# Patient Record
Sex: Female | Born: 1935 | Race: Black or African American | Hispanic: No | Marital: Single | Smoking: Never smoker
Health system: Southern US, Community
[De-identification: ages and names within clinical notes are randomized; demographics above are authoritative.]

## PROBLEM LIST (undated history)

## (undated) DIAGNOSIS — H269 Unspecified cataract: Secondary | ICD-10-CM

## (undated) DIAGNOSIS — E785 Hyperlipidemia, unspecified: Secondary | ICD-10-CM

## (undated) DIAGNOSIS — I10 Essential (primary) hypertension: Secondary | ICD-10-CM

## (undated) DIAGNOSIS — M1990 Unspecified osteoarthritis, unspecified site: Secondary | ICD-10-CM

## (undated) DIAGNOSIS — M810 Age-related osteoporosis without current pathological fracture: Secondary | ICD-10-CM

## (undated) DIAGNOSIS — H409 Unspecified glaucoma: Secondary | ICD-10-CM

## (undated) HISTORY — DX: Hyperlipidemia, unspecified: E78.5

## (undated) HISTORY — DX: Age-related osteoporosis without current pathological fracture: M81.0

## (undated) HISTORY — DX: Essential (primary) hypertension: I10

## (undated) HISTORY — DX: Unspecified glaucoma: H40.9

## (undated) HISTORY — DX: Unspecified osteoarthritis, unspecified site: M19.90

## (undated) HISTORY — DX: Unspecified cataract: H26.9

---

## 2005-02-01 ENCOUNTER — Ambulatory Visit: Payer: Self-pay | Admitting: Gastroenterology

## 2010-01-04 HISTORY — PX: EYE SURGERY: SHX253

## 2011-05-11 ENCOUNTER — Ambulatory Visit: Payer: Self-pay | Admitting: Internal Medicine

## 2011-11-26 ENCOUNTER — Ambulatory Visit: Payer: Self-pay | Admitting: Internal Medicine

## 2012-07-23 ENCOUNTER — Emergency Department: Payer: Self-pay | Admitting: Emergency Medicine

## 2012-07-23 LAB — COMPREHENSIVE METABOLIC PANEL
Albumin: 3.8 g/dL (ref 3.4–5.0)
Alkaline Phosphatase: 90 U/L (ref 50–136)
Anion Gap: 5 — ABNORMAL LOW (ref 7–16)
BUN: 14 mg/dL (ref 7–18)
Chloride: 106 mmol/L (ref 98–107)
EGFR (African American): 41 — ABNORMAL LOW
Glucose: 137 mg/dL — ABNORMAL HIGH (ref 65–99)
Osmolality: 278 (ref 275–301)
SGOT(AST): 21 U/L (ref 15–37)
SGPT (ALT): 14 U/L (ref 12–78)
Sodium: 138 mmol/L (ref 136–145)
Total Protein: 7.5 g/dL (ref 6.4–8.2)

## 2012-07-23 LAB — URINALYSIS, COMPLETE
Bacteria: NONE SEEN
Blood: NEGATIVE
Glucose,UR: NEGATIVE mg/dL (ref 0–75)
Nitrite: NEGATIVE
Ph: 5 (ref 4.5–8.0)
Specific Gravity: 1.018 (ref 1.003–1.030)
Squamous Epithelial: 4
WBC UR: 7 /HPF (ref 0–5)

## 2012-07-23 LAB — CBC
HCT: 39.2 % (ref 35.0–47.0)
HGB: 12.8 g/dL (ref 12.0–16.0)
MCH: 28.4 pg (ref 26.0–34.0)
MCHC: 32.7 g/dL (ref 32.0–36.0)
MCV: 87 fL (ref 80–100)
RBC: 4.5 10*6/uL (ref 3.80–5.20)
WBC: 7 10*3/uL (ref 3.6–11.0)

## 2012-07-23 LAB — TROPONIN I: Troponin-I: <0.02 ng/mL

## 2013-04-19 ENCOUNTER — Emergency Department: Payer: Self-pay | Admitting: Internal Medicine

## 2013-04-19 LAB — COMPREHENSIVE METABOLIC PANEL
ALK PHOS: 78 U/L
ANION GAP: 7 (ref 7–16)
AST: 22 U/L (ref 15–37)
Albumin: 3.6 g/dL (ref 3.4–5.0)
BILIRUBIN TOTAL: 0.8 mg/dL (ref 0.2–1.0)
BUN: 12 mg/dL (ref 7–18)
CO2: 25 mmol/L (ref 21–32)
Calcium, Total: 9.2 mg/dL (ref 8.5–10.1)
Chloride: 107 mmol/L (ref 98–107)
Creatinine: 1.08 mg/dL (ref 0.60–1.30)
GFR CALC AF AMER: 52 — AB
GFR CALC NON AF AMER: 45 — AB
Glucose: 95 mg/dL (ref 65–99)
Osmolality: 277 (ref 275–301)
POTASSIUM: 3.5 mmol/L (ref 3.5–5.1)
SGPT (ALT): 16 U/L (ref 12–78)
SODIUM: 139 mmol/L (ref 136–145)
TOTAL PROTEIN: 7.8 g/dL (ref 6.4–8.2)

## 2013-04-19 LAB — URINALYSIS, COMPLETE
Bacteria: NONE SEEN
Bilirubin,UR: NEGATIVE
Glucose,UR: NEGATIVE mg/dL (ref 0–75)
KETONE: NEGATIVE
Leukocyte Esterase: NEGATIVE
Nitrite: NEGATIVE
Ph: 8 (ref 4.5–8.0)
Protein: NEGATIVE
Specific Gravity: 1.003 (ref 1.003–1.030)
Squamous Epithelial: <1

## 2013-04-19 LAB — CBC
HCT: 40.9 % (ref 35.0–47.0)
HGB: 13.3 g/dL (ref 12.0–16.0)
MCH: 28.6 pg (ref 26.0–34.0)
MCHC: 32.5 g/dL (ref 32.0–36.0)
MCV: 88 fL (ref 80–100)
Platelet: 220 10*3/uL (ref 150–440)
RBC: 4.64 10*6/uL (ref 3.80–5.20)
RDW: 15 % — ABNORMAL HIGH (ref 11.5–14.5)
WBC: 5.3 10*3/uL (ref 3.6–11.0)

## 2013-04-19 LAB — TROPONIN I

## 2013-07-04 ENCOUNTER — Encounter: Payer: Self-pay | Admitting: General Surgery

## 2013-07-04 ENCOUNTER — Ambulatory Visit (INDEPENDENT_AMBULATORY_CARE_PROVIDER_SITE_OTHER): Payer: Medicare Other | Admitting: General Surgery

## 2013-07-04 VITALS — BP 130/68 | HR 78 | Resp 14 | Ht 62.0 in | Wt 138.0 lb

## 2013-07-04 DIAGNOSIS — D1739 Benign lipomatous neoplasm of skin and subcutaneous tissue of other sites: Secondary | ICD-10-CM

## 2013-07-04 DIAGNOSIS — D171 Benign lipomatous neoplasm of skin and subcutaneous tissue of trunk: Secondary | ICD-10-CM

## 2013-07-04 NOTE — Patient Instructions (Addendum)
Patient to be scheduled for surgery. The patient is aware to call back for any questions or concerns.  Patient's surgery has been scheduled for 07-11-13 at [facility]. This patient has been asked to decrease current 325 mg aspirin to 81 mg aspirin starting one week prior to procedure.

## 2013-07-04 NOTE — Progress Notes (Signed)
Patient ID: Jenna Arnold, female   DOB: 1920/02/19, 80 y.o.   MRN: [id number]  Chief Complaint  Patient presents with  . Other    Evaluation of Adrenal Tumor    HPI Jenna Arnold is a 80 y.o. female who presents for an evaluation of adrenal tumor. She had an abdominal x-ray performed on 07/02/13. The patient complains of left flank pain that is swollen. She states the pain is so severe she vomits at time. She also has loss of appetite.This started back in April 2015. The pain is controlled by pain medication. The patient also had a CT of the abdomen/pelvis in April 2015. No fever. The pain is described as an aching sensation on the inside. Pain can not be reproduced with touch. No problems with bowel movements or urination.   HPI  Past Medical History  Diagnosis Date  . Hypertension   . Hyperlipidemia   . Arthritis     Past Surgical History  Procedure Laterality Date  . Eye surgery Bilateral 2012    History reviewed. No pertinent family history.  Social History History  Substance Use Topics  . Smoking status: Never Smoker   . Smokeless tobacco: Current User    Types: Snuff  . Alcohol Use: Yes    No Known Allergies  Current Outpatient Prescriptions  Medication Sig Dispense Refill  . amLODipine (NORVASC) 10 MG tablet Take 10 mg by mouth daily.      Marland Kitchen aspirin 325 MG tablet Take 325 mg by mouth daily.      Marland Kitchen atorvastatin (LIPITOR) 40 MG tablet Take 40 mg by mouth daily.      Marland Kitchen CALCIUM PO Take 1 tablet by mouth daily.      . metoprolol succinate (TOPROL-XL) 50 MG 24 hr tablet Take 50 mg by mouth daily. Take with or immediately following a meal.      . traMADol-acetaminophen (ULTRACET) 37.5-325 MG per tablet Take 1 tablet by mouth daily.       No current facility-administered medications for this visit.    Review of Systems Review of Systems  Constitutional: Negative.   Respiratory: Negative.   Cardiovascular: Negative.   Gastrointestinal: Positive for vomiting.     Blood pressure 130/68, pulse 78, resp. rate 14, height 5\' 2"  (1.575 m), weight 138 lb (62.596 kg).  Physical Exam Physical Exam  Constitutional: She is oriented to person, place, and time. She appears well-developed and well-nourished.  Cardiovascular: Normal rate, regular rhythm and normal heart sounds.   No murmur heard. Pulmonary/Chest: Effort normal and breath sounds normal.  Abdominal: Soft. Normal appearance and bowel sounds are normal. There is no hepatosplenomegaly. There is no tenderness.  Musculoskeletal:       Arms: Neurological: She is alert and oriented to person, place, and time.  Skin: Skin is warm and dry.    Data Reviewed  CT of abdomen/pelvis reviewed. 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 However this has been stable in size for a long time. Large lipoma left flank, likely causing her pain.  Assessment    6 cm lipoma on the left flank. Likely causing pain in the left flank.     Plan    Excision of left flank lipoma in outpatient surgery. Under  MAC. Discussed fully with pt and she is agreeable.    Patient's surgery has been scheduled for 07-11-13 at [facility]. This patient has been asked to decrease current 325 mg aspirin to 81 mg aspirin starting one week prior to procedure.  SANKAR,SEEPLAPUTHUR G 07/05/2013, 8:52 AM

## 2013-07-05 ENCOUNTER — Encounter: Payer: Self-pay | Admitting: General Surgery

## 2013-07-05 NOTE — Addendum Note (Signed)
Addended by: Christene Lye on: 07/05/2013 08:59 AM   Modules accepted: Orders

## 2013-07-09 ENCOUNTER — Ambulatory Visit: Payer: Self-pay | Admitting: General Surgery

## 2013-07-09 LAB — CBC WITH DIFFERENTIAL/PLATELET
BASOS ABS: 0.1 10*3/uL (ref 0.0–0.1)
Basophil %: 1.2 %
EOS PCT: 7.1 %
Eosinophil #: 0.4 10*3/uL (ref 0.0–0.7)
HCT: 38.1 % (ref 35.0–47.0)
HGB: 12.6 g/dL (ref 12.0–16.0)
LYMPHS ABS: 1.5 10*3/uL (ref 1.0–3.6)
LYMPHS PCT: 27.4 %
MCH: 29.1 pg (ref 26.0–34.0)
MCHC: 32.9 g/dL (ref 32.0–36.0)
MCV: 88 fL (ref 80–100)
MONO ABS: 0.6 x10 3/mm (ref 0.2–0.9)
MONOS PCT: 11.1 %
Neutrophil #: 2.9 10*3/uL (ref 1.4–6.5)
Neutrophil %: 53.2 %
Platelet: 279 10*3/uL (ref 150–440)
RBC: 4.32 10*6/uL (ref 3.80–5.20)
RDW: 14.6 % — ABNORMAL HIGH (ref 11.5–14.5)
WBC: 5.5 10*3/uL (ref 3.6–11.0)

## 2013-07-09 LAB — BASIC METABOLIC PANEL
Anion Gap: 4 — ABNORMAL LOW (ref 7–16)
BUN: 12 mg/dL (ref 7–18)
CHLORIDE: 106 mmol/L (ref 98–107)
CO2: 30 mmol/L (ref 21–32)
Calcium, Total: 9.5 mg/dL (ref 8.5–10.1)
Creatinine: 1.13 mg/dL (ref 0.60–1.30)
EGFR (African American): 49 — ABNORMAL LOW
GFR CALC NON AF AMER: 42 — AB
Glucose: 98 mg/dL (ref 65–99)
OSMOLALITY: 279 (ref 275–301)
Potassium: 4 mmol/L (ref 3.5–5.1)
Sodium: 140 mmol/L (ref 136–145)

## 2013-07-11 ENCOUNTER — Ambulatory Visit: Payer: Self-pay | Admitting: General Surgery

## 2013-07-11 DIAGNOSIS — D216 Benign neoplasm of connective and other soft tissue of trunk, unspecified: Secondary | ICD-10-CM

## 2013-07-11 HISTORY — PX: LIPOMA EXCISION: SHX5283

## 2013-07-12 ENCOUNTER — Encounter: Payer: Self-pay | Admitting: General Surgery

## 2013-07-13 LAB — PATHOLOGY REPORT

## 2013-07-17 ENCOUNTER — Encounter: Payer: Self-pay | Admitting: General Surgery

## 2013-07-25 ENCOUNTER — Ambulatory Visit (INDEPENDENT_AMBULATORY_CARE_PROVIDER_SITE_OTHER): Payer: Self-pay | Admitting: General Surgery

## 2013-07-25 ENCOUNTER — Encounter: Payer: Self-pay | Admitting: General Surgery

## 2013-07-25 VITALS — BP 124/60 | HR 62 | Resp 14 | Wt 137.0 lb

## 2013-07-25 DIAGNOSIS — D1739 Benign lipomatous neoplasm of skin and subcutaneous tissue of other sites: Secondary | ICD-10-CM

## 2013-07-25 DIAGNOSIS — D171 Benign lipomatous neoplasm of skin and subcutaneous tissue of trunk: Secondary | ICD-10-CM

## 2013-07-25 NOTE — Progress Notes (Signed)
Patient ID: Jenna Arnold, female   DOB: 1920/12/12, 80 y.o.   MRN: [id number]  The patient presents for a post op lipoma removed from the left flank. The procedure was performed on 07/11/13. No complaints at this time.   Pt is happy -no more left flank discomfort. Path- benign lipoma. Incision   healing well. No signs of infection/serma.  Patient to return as needed.

## 2013-07-25 NOTE — Patient Instructions (Signed)
Patient to return as needed. The patient is aware to call back for any questions or concerns. 

## 2013-11-05 ENCOUNTER — Encounter: Payer: Self-pay | Admitting: General Surgery

## 2013-11-07 ENCOUNTER — Emergency Department: Payer: Self-pay | Admitting: Emergency Medicine

## 2013-11-07 LAB — COMPREHENSIVE METABOLIC PANEL
ALT: 15 U/L
Albumin: 3.5 g/dL (ref 3.4–5.0)
Alkaline Phosphatase: 84 U/L
Anion Gap: 9 (ref 7–16)
BUN: 13 mg/dL (ref 7–18)
Bilirubin,Total: 0.8 mg/dL (ref 0.2–1.0)
CREATININE: 1.4 mg/dL — AB (ref 0.60–1.30)
Calcium, Total: 9 mg/dL (ref 8.5–10.1)
Chloride: 106 mmol/L (ref 98–107)
Co2: 25 mmol/L (ref 21–32)
EGFR (African American): 45 — ABNORMAL LOW
GFR CALC NON AF AMER: 37 — AB
Glucose: 121 mg/dL — ABNORMAL HIGH (ref 65–99)
OSMOLALITY: 281 (ref 275–301)
Potassium: 4.6 mmol/L (ref 3.5–5.1)
SGOT(AST): 29 U/L (ref 15–37)
Sodium: 140 mmol/L (ref 136–145)
TOTAL PROTEIN: 7.6 g/dL (ref 6.4–8.2)

## 2013-11-07 LAB — CBC
HCT: 40.1 % (ref 35.0–47.0)
HGB: 12.8 g/dL (ref 12.0–16.0)
MCH: 28.5 pg (ref 26.0–34.0)
MCHC: 32 g/dL (ref 32.0–36.0)
MCV: 89 fL (ref 80–100)
Platelet: 236 10*3/uL (ref 150–440)
RBC: 4.5 10*6/uL (ref 3.80–5.20)
RDW: 14.9 % — AB (ref 11.5–14.5)
WBC: 5.6 10*3/uL (ref 3.6–11.0)

## 2013-11-07 LAB — URINALYSIS, COMPLETE
BLOOD: NEGATIVE
Bilirubin,UR: NEGATIVE
Glucose,UR: NEGATIVE mg/dL (ref 0–75)
Hyaline Cast: 11
Ketone: NEGATIVE
NITRITE: NEGATIVE
Ph: 6 (ref 4.5–8.0)
Protein: 30
RBC,UR: 2 /HPF (ref 0–5)
Specific Gravity: 1.008 (ref 1.003–1.030)
WBC UR: 3 /HPF (ref 0–5)

## 2013-11-07 LAB — LIPASE, BLOOD: Lipase: 94 U/L (ref 73–393)

## 2013-11-07 LAB — TROPONIN I: Troponin-I: <0.02 ng/mL

## 2014-04-27 NOTE — Op Note (Signed)
PATIENT NAME:  Jenna Arnold, Jenna Arnold MR#:  [id number] DATE OF BIRTH:  Aug 14, 1920  DATE OF PROCEDURE:  07/11/2013.  PREOPERATIVE DIAGNOSIS: Subcutaneous mass in the left flank.   POSTOPERATIVE DIAGNOSIS: Lipoma located in the left flank involving the muscle.   OPERATION PERFORMED: Excision of lipoma left flank area.   SURGEON: Mckinley Jewel, M.D.   ANESTHESIA: Monitored care and with the use of 0.5% Marcaine mixed with 1% Xylocaine. A total of 17 mL used.   PROCEDURE: The patient was placed in supine position on the operating table and then placed in the slight right lateral position, held in place with a bean bag. Adequate sedation was instituted and the patient monitored by anesthesiologist.   The left flank area was prepped and draped out as a sterile field. Timeout was performed. The palpable mass, approximately 5 cm, was located just above the iliac crest area. Local anesthetic was instilled and a transverse incision was made about 3 to 4 cm, and then carefully deepened through to the deep subcutaneous plane where a well-circumscribed, lipomatous mass was encountered. This was circumferentially freed but it did seem to penetrate the fascia and was overlying the external oblique muscle at the site. Using cautery it was carefully taken down and removed from the underlying muscle. It measured 5 cm in maximal size.   After ensuring hemostasis, the fascia overlying the muscle was reapproximated with 3-0 Vicryl running stitch. The subcutaneous tissue was also closed with 3-0 Vicryl and the skin closed with subcuticular 4-0 Vicryl, covered with Dermabond. The procedure was well tolerated. The patient  was subsequently returned to the recovery room in stable condition.    ____________________________ S.Robinette Haines, MD sgs:lt D: 07/12/2013 06:25:41 ET T: 07/12/2013 08:10:36 ET JOB#: [id number]  cc: Synthia Innocent. Jamal Collin, MD, <Dictator> [facility] Robinette Haines MD ELECTRONICALLY SIGNED 07/12/2013 10:47

## 2014-08-27 ENCOUNTER — Ambulatory Visit (INDEPENDENT_AMBULATORY_CARE_PROVIDER_SITE_OTHER): Payer: Medicare Other | Admitting: Podiatry

## 2014-08-27 ENCOUNTER — Encounter: Payer: Self-pay | Admitting: Podiatry

## 2014-08-27 VITALS — BP 186/83 | HR 58 | Resp 18

## 2014-08-27 DIAGNOSIS — M79674 Pain in right toe(s): Secondary | ICD-10-CM | POA: Diagnosis not present

## 2014-08-27 DIAGNOSIS — B351 Tinea unguium: Secondary | ICD-10-CM

## 2014-08-27 DIAGNOSIS — L600 Ingrowing nail: Secondary | ICD-10-CM

## 2014-08-27 NOTE — Patient Instructions (Signed)
Continue epsom salt soaks Monitor for any signs/symptoms of infection. Call the office immediately if any occur or go directly to the emergency room. Call with any questions/concerns. Follow-up in 2-3 weeks or sooner if your toenail is still bothering you or if there are any further concerns or questions.  It was nice to meet you. Have a good week

## 2014-08-27 NOTE — Progress Notes (Signed)
   Subjective:    Patient ID: Jenna Arnold, female    DOB: Mar 23, 1920, 81 y.o.   MRN: [id number]  HPI  81 year old female presents the office today with concerns of right big toenail pain. She states it is particularly painful at the end of her toenail  Especially with pressure. She denies any redness or drainage from along the area. His his been ongoing for the last several months.  She has been soaking the foot in Epson salt soaks including alcohol. No other complaints at this time.  Review of Systems  All other systems reviewed and are negative.      Objective:   Physical Exam AAO x3, NAD DP/PT pulses palpable bilaterally 1/4, CRT less than 3 seconds Protective sensation intact with Simms Weinstein monofilament, vibratory sensation intact, Achilles tendon reflex intact  right hallux nails hypertrophic, dystrophic, brittle, elongated nails evidence of incurvation of both the medial and lateral nail borders distally. There is tenderness the nail  distally however there is no pain proximally. There is no surrounding edema, erythema, drainage/purulence, ascending saline disc. No malodor. No  otherareas of tenderness to bilateral lower extremities. MMT 5/5, ROM WNL.  No open lesions or pre-ulcerative lesions.  No overlying edema, erythema, increase in warmth to bilateral lower extremities.  No pain with calf compression, swelling, warmth, erythema bilaterally.      Assessment & Plan:   81 year old female with right hallux onychomycosis, ingrown toenail -Treatment options discussed including all alternatives, risks, and complications - I discussed with patient partial nail avulsion however given decreased pulses and there is no signs of infection and wishes to hold off on that at this time. We discussed nail debridement. Nails very tender distally to try to debride therefore a total of 3 mL of a one-to-one mixture of 2% lidocaine plain and 0.5% Marcaine plain was infiltrated in a hallux block  fashion. Once anesthetized the nail was debrided treatment the symptomatic portion of the ingrown toenail. There is only a small amount of bleeding present. There is no pus or signs of infection. Recommended continue with Epsom salts soaks and cover with antibiotic ointment and a Band-Aid. Monitor for any clinical signs or symptoms of infection and directed to call the office immediately should any occur or go to the ER. -Follow-up in 3 weeks if symptoms continue or sooner if any problems arise. In the meantime, encouraged to call the office with any questions, concerns, change in symptoms.   Celesta Gentile, DPM

## 2014-09-17 ENCOUNTER — Encounter: Payer: Self-pay | Admitting: Podiatry

## 2014-09-17 ENCOUNTER — Ambulatory Visit (INDEPENDENT_AMBULATORY_CARE_PROVIDER_SITE_OTHER): Payer: Medicare Other | Admitting: Podiatry

## 2014-09-17 VITALS — BP 130/55 | HR 58 | Resp 18

## 2014-09-17 DIAGNOSIS — L600 Ingrowing nail: Secondary | ICD-10-CM

## 2014-09-17 DIAGNOSIS — B351 Tinea unguium: Secondary | ICD-10-CM

## 2014-09-17 DIAGNOSIS — M79674 Pain in right toe(s): Secondary | ICD-10-CM

## 2014-09-17 NOTE — Progress Notes (Signed)
Patient ID: Jenna Arnold, female   DOB: 1920/02/20, 81 y.o.   MRN: [id number]  Subjective: 81 year old female presents the office they for followup evaluation of right hallux ingrown toenail. She states that area somewhat sore although significantly improved. She denies any drainage or purulence. She's been soaking the foot in Epson salts covering with antibiotic ointment and a Band-Aid. She denies any redness or swelling. She also states the remainder of her toenails are also ingrown and painful particularly shoe gear or pressure. Denies any redness or drainage from the nail sites. No other complaints at this time in no acute changes.  Objective: AAO x3, NAD DP/PT pulses palpable 1/4, CRT less than 3 seconds Nails are hypertrophic, dystrophic, brittle, discolored, elongated x10. Especially bilateral hallux nails are incurvated on both the medial and lateral nail borders. Right hallux toenail with a significantly decreased meniscal nail border. There is no drainage or purulence at this time. There is no surrounding erythema or drainage. No open lesions or pre-ulcer lesions. No pain with calf compression, swelling, warmth, erythema. No other areas of tenderness to bilateral lower Ms.  Assessment: Symptomatic onychomycosis with ingrown toenails  Plan: -Treatment options discussed including all alternatives, risks, and complications -Right hallux nail was debrided. There is no drainage purulence. No signs of infection. Continue to monitor. -Nails sharply debrided I37 without complications discussed bleeding. -Discussed daily foot inspection. -Follow-up in 3 months or sooner if any problems arise. In the meantime, encouraged to call the office with any questions, concerns, change in symptoms.   Celesta Gentile, DPM

## 2014-12-17 ENCOUNTER — Ambulatory Visit: Payer: Medicare Other | Admitting: Podiatry

## 2014-12-31 ENCOUNTER — Encounter (HOSPITAL_COMMUNITY): Payer: Self-pay | Admitting: Neurology

## 2014-12-31 ENCOUNTER — Inpatient Hospital Stay (HOSPITAL_COMMUNITY): Payer: Medicare Other

## 2014-12-31 ENCOUNTER — Emergency Department (HOSPITAL_COMMUNITY): Payer: Medicare Other

## 2014-12-31 ENCOUNTER — Inpatient Hospital Stay (HOSPITAL_COMMUNITY)
Admission: EM | Admit: 2014-12-31 | Discharge: 2015-01-05 | DRG: 061 | Disposition: E | Payer: Medicare Other | Source: Intra-hospital | Attending: Neurology | Admitting: Neurology

## 2014-12-31 DIAGNOSIS — Z7982 Long term (current) use of aspirin: Secondary | ICD-10-CM

## 2014-12-31 DIAGNOSIS — E876 Hypokalemia: Secondary | ICD-10-CM | POA: Diagnosis present

## 2014-12-31 DIAGNOSIS — R402433 Glasgow coma scale score 3-8, at hospital admission: Secondary | ICD-10-CM | POA: Diagnosis not present

## 2014-12-31 DIAGNOSIS — N289 Disorder of kidney and ureter, unspecified: Secondary | ICD-10-CM | POA: Diagnosis present

## 2014-12-31 DIAGNOSIS — I10 Essential (primary) hypertension: Secondary | ICD-10-CM | POA: Diagnosis present

## 2014-12-31 DIAGNOSIS — G936 Cerebral edema: Secondary | ICD-10-CM | POA: Diagnosis present

## 2014-12-31 DIAGNOSIS — R471 Dysarthria and anarthria: Secondary | ICD-10-CM | POA: Diagnosis present

## 2014-12-31 DIAGNOSIS — R29719 NIHSS score 19: Secondary | ICD-10-CM | POA: Diagnosis present

## 2014-12-31 DIAGNOSIS — I509 Heart failure, unspecified: Secondary | ICD-10-CM

## 2014-12-31 DIAGNOSIS — I639 Cerebral infarction, unspecified: Secondary | ICD-10-CM | POA: Diagnosis not present

## 2014-12-31 DIAGNOSIS — G8104 Flaccid hemiplegia affecting left nondominant side: Secondary | ICD-10-CM | POA: Diagnosis present

## 2014-12-31 DIAGNOSIS — R54 Age-related physical debility: Secondary | ICD-10-CM | POA: Diagnosis present

## 2014-12-31 DIAGNOSIS — I63422 Cerebral infarction due to embolism of left anterior cerebral artery: Secondary | ICD-10-CM | POA: Diagnosis present

## 2014-12-31 DIAGNOSIS — I63131 Cerebral infarction due to embolism of right carotid artery: Secondary | ICD-10-CM | POA: Diagnosis not present

## 2014-12-31 DIAGNOSIS — R29810 Facial weakness: Secondary | ICD-10-CM | POA: Diagnosis present

## 2014-12-31 DIAGNOSIS — Z720 Tobacco use: Secondary | ICD-10-CM

## 2014-12-31 DIAGNOSIS — I63411 Cerebral infarction due to embolism of right middle cerebral artery: Secondary | ICD-10-CM | POA: Diagnosis present

## 2014-12-31 DIAGNOSIS — H409 Unspecified glaucoma: Secondary | ICD-10-CM | POA: Diagnosis present

## 2014-12-31 DIAGNOSIS — E785 Hyperlipidemia, unspecified: Secondary | ICD-10-CM | POA: Diagnosis present

## 2014-12-31 DIAGNOSIS — I6789 Other cerebrovascular disease: Secondary | ICD-10-CM | POA: Diagnosis not present

## 2014-12-31 DIAGNOSIS — G8194 Hemiplegia, unspecified affecting left nondominant side: Secondary | ICD-10-CM | POA: Diagnosis not present

## 2014-12-31 DIAGNOSIS — Z79899 Other long term (current) drug therapy: Secondary | ICD-10-CM | POA: Diagnosis not present

## 2014-12-31 DIAGNOSIS — I6521 Occlusion and stenosis of right carotid artery: Secondary | ICD-10-CM | POA: Diagnosis present

## 2014-12-31 DIAGNOSIS — M810 Age-related osteoporosis without current pathological fracture: Secondary | ICD-10-CM | POA: Diagnosis present

## 2014-12-31 DIAGNOSIS — Z515 Encounter for palliative care: Secondary | ICD-10-CM | POA: Diagnosis not present

## 2014-12-31 DIAGNOSIS — R4020 Unspecified coma: Secondary | ICD-10-CM | POA: Diagnosis present

## 2014-12-31 DIAGNOSIS — I6350 Cerebral infarction due to unspecified occlusion or stenosis of unspecified cerebral artery: Secondary | ICD-10-CM

## 2014-12-31 DIAGNOSIS — Z66 Do not resuscitate: Secondary | ICD-10-CM | POA: Diagnosis not present

## 2014-12-31 DIAGNOSIS — G935 Compression of brain: Secondary | ICD-10-CM | POA: Diagnosis present

## 2014-12-31 DIAGNOSIS — I63311 Cerebral infarction due to thrombosis of right middle cerebral artery: Secondary | ICD-10-CM | POA: Diagnosis not present

## 2014-12-31 DIAGNOSIS — R29818 Other symptoms and signs involving the nervous system: Secondary | ICD-10-CM

## 2014-12-31 LAB — I-STAT CHEM 8, ED
BUN: 16 mg/dL (ref 6–20)
CHLORIDE: 105 mmol/L (ref 101–111)
CREATININE: 1.1 mg/dL — AB (ref 0.44–1.00)
Calcium, Ion: 1.16 mmol/L (ref 1.13–1.30)
GLUCOSE: 148 mg/dL — AB (ref 65–99)
HEMATOCRIT: 46 % (ref 36.0–46.0)
HEMOGLOBIN: 15.6 g/dL — AB (ref 12.0–15.0)
POTASSIUM: 3.3 mmol/L — AB (ref 3.5–5.1)
Sodium: 140 mmol/L (ref 135–145)
TCO2: 22 mmol/L (ref 0–100)

## 2014-12-31 LAB — PROTIME-INR
INR: 1.04 (ref 0.00–1.49)
Prothrombin Time: 13.8 seconds (ref 11.6–15.2)

## 2014-12-31 LAB — COMPREHENSIVE METABOLIC PANEL
ALBUMIN: 4.1 g/dL (ref 3.5–5.0)
ALT: 12 U/L — AB (ref 14–54)
AST: 18 U/L (ref 15–41)
Alkaline Phosphatase: 79 U/L (ref 38–126)
Anion gap: 13 (ref 5–15)
BUN: 15 mg/dL (ref 6–20)
CHLORIDE: 105 mmol/L (ref 101–111)
CO2: 20 mmol/L — AB (ref 22–32)
CREATININE: 1.15 mg/dL — AB (ref 0.44–1.00)
Calcium: 9.5 mg/dL (ref 8.9–10.3)
GFR calc Af Amer: 46 mL/min — ABNORMAL LOW (ref >60)
GFR, EST NON AFRICAN AMERICAN: 40 mL/min — AB (ref >60)
GLUCOSE: 154 mg/dL — AB (ref 65–99)
POTASSIUM: 3.5 mmol/L (ref 3.5–5.1)
SODIUM: 138 mmol/L (ref 135–145)
Total Bilirubin: 0.8 mg/dL (ref 0.3–1.2)
Total Protein: 7.6 g/dL (ref 6.5–8.1)

## 2014-12-31 LAB — I-STAT TROPONIN, ED: TROPONIN I, POC: 0 ng/mL (ref 0.00–0.08)

## 2014-12-31 LAB — DIFFERENTIAL
BASOS ABS: 0 10*3/uL (ref 0.0–0.1)
BASOS PCT: 1 %
EOS ABS: 0.2 10*3/uL (ref 0.0–0.7)
Eosinophils Relative: 4 %
Lymphocytes Relative: 53 %
Lymphs Abs: 3 10*3/uL (ref 0.7–4.0)
Monocytes Absolute: 0.6 10*3/uL (ref 0.1–1.0)
Monocytes Relative: 10 %
NEUTROS ABS: 1.9 10*3/uL (ref 1.7–7.7)
NEUTROS PCT: 34 %

## 2014-12-31 LAB — CBC
HEMATOCRIT: 38.9 % (ref 36.0–46.0)
Hemoglobin: 12.5 g/dL (ref 12.0–15.0)
MCH: 28.5 pg (ref 26.0–34.0)
MCHC: 32.1 g/dL (ref 30.0–36.0)
MCV: 88.8 fL (ref 78.0–100.0)
Platelets: 211 10*3/uL (ref 150–400)
RBC: 4.38 MIL/uL (ref 3.87–5.11)
RDW: 14.6 % (ref 11.5–15.5)
WBC: 5.7 10*3/uL (ref 4.0–10.5)

## 2014-12-31 LAB — APTT: APTT: 27 s (ref 24–37)

## 2014-12-31 LAB — MRSA PCR SCREENING: MRSA by PCR: NEGATIVE

## 2014-12-31 LAB — GLUCOSE, CAPILLARY: Glucose-Capillary: 134 mg/dL — ABNORMAL HIGH (ref 65–99)

## 2014-12-31 MED ORDER — SODIUM CHLORIDE 0.9 % IV SOLN
INTRAVENOUS | Status: DC
  Administered 2014-12-31: 23:00:00 via INTRAVENOUS

## 2014-12-31 MED ORDER — ALTEPLASE (STROKE) FULL DOSE INFUSION
49.0000 mg | INTRAVENOUS | Status: AC
  Administered 2014-12-31: 49 mg via INTRAVENOUS
  Filled 2014-12-31: qty 49

## 2014-12-31 MED ORDER — HYDRALAZINE HCL 20 MG/ML IJ SOLN
10.0000 mg | INTRAMUSCULAR | Status: DC | PRN
  Administered 2014-12-31 – 2015-01-01 (×4): 10 mg via INTRAVENOUS
  Filled 2014-12-31 (×4): qty 1

## 2014-12-31 MED ORDER — ONDANSETRON HCL 4 MG/2ML IJ SOLN
INTRAMUSCULAR | Status: AC
  Filled 2014-12-31: qty 2

## 2014-12-31 MED ORDER — WHITE PETROLATUM GEL
Status: AC
  Filled 2014-12-31: qty 1

## 2014-12-31 MED ORDER — LATANOPROST 0.005 % OP SOLN
1.0000 [drp] | Freq: Every day | OPHTHALMIC | Status: DC
  Administered 2014-12-31: 1 [drp] via OPHTHALMIC
  Filled 2014-12-31: qty 2.5

## 2014-12-31 MED ORDER — PANTOPRAZOLE SODIUM 40 MG IV SOLR
40.0000 mg | Freq: Every day | INTRAVENOUS | Status: DC
  Administered 2014-12-31 – 2015-01-01 (×2): 40 mg via INTRAVENOUS
  Filled 2014-12-31 (×2): qty 40

## 2014-12-31 MED ORDER — ONDANSETRON HCL 4 MG/2ML IJ SOLN
4.0000 mg | Freq: Once | INTRAMUSCULAR | Status: AC
  Administered 2014-12-31: 4 mg via INTRAVENOUS

## 2014-12-31 MED ORDER — ACETAMINOPHEN 325 MG PO TABS: 650.0000 mg | ORAL_TABLET | ORAL | Status: DC | PRN

## 2014-12-31 MED ORDER — ACETAMINOPHEN 650 MG RE SUPP: 650.0000 mg | RECTAL | Status: DC | PRN

## 2014-12-31 MED ORDER — LABETALOL HCL 5 MG/ML IV SOLN
10.0000 mg | INTRAVENOUS | Status: DC | PRN
  Administered 2015-01-01: 10 mg via INTRAVENOUS
  Filled 2014-12-31: qty 4

## 2014-12-31 MED ORDER — STROKE: EARLY STAGES OF RECOVERY BOOK
Freq: Once | Status: DC
  Filled 2014-12-31 (×2): qty 1

## 2014-12-31 NOTE — Progress Notes (Signed)
Pt vomited and became less responsive at 1715.  Paged neurology.  Zofran 4mg  ordered and given.  STAT head CT to be done.  Will continue to monitor pt.

## 2014-12-31 NOTE — Progress Notes (Signed)
*  PRELIMINARY RESULTS* Vascular Ultrasound Carotid Duplex (Doppler) has been completed.   Study was technically difficult and limited due to poor patient cooperation. Findings suggest 1-39% left internal carotid artery stenosis. The left vertebral artery is patent with antegrade flow.  The right internal carotid artery exhibits atypical spiked/thumped waveforms suggestive of a distal obstruction.  The right vertebral artery was difficult to visualize, however there is evidence of thumped waveforms, suggestive of possible distal obstruction.  01/04/2015 4:03 PM Maudry Mayhew, RVT, RDCS, RDMS

## 2014-12-31 NOTE — ED Notes (Signed)
Pt CBG, 134. Nurse was notified. 

## 2014-12-31 NOTE — ED Provider Notes (Signed)
CSN: [id number]     Arrival date & time 01/02/2015  [id number] History   None     Initial patient contact at 1000 Complaint: Code stroke Level V caveat: Patient is unable to speak and provide any history HPI Patient presents to the emergency room as an acute stroke. She was last seen normal at  when she got up for breakfast this morning around 915am.  Patient was heard falling and EMS was called for a possible stroke. When they went to evaluated her they noted a left-sided facial droop and the patient was confused and not responding to questions. She appeared to have left-sided weakness. Past Medical History  Diagnosis Date  . Hypertension   . Hyperlipidemia   . Arthritis   . Cataract   . Glaucoma   . Osteoporosis    Past Surgical History  Procedure Laterality Date  . Eye surgery Bilateral 2012  . Lipoma excision Left 07/11/13    left flank   No family history on file. Social History  Substance Use Topics  . Smoking status: Never Smoker   . Smokeless tobacco: Current User    Types: Snuff  . Alcohol Use: Yes   OB History    Gravida Para Term Preterm AB TAB SAB Ectopic Multiple Living   6 6        6      Review of Systems  Unable to perform ROS: Patient nonverbal      Allergies  Review of patient's allergies indicates no known allergies.  Home Medications   Prior to Admission medications   Medication Sig Start Date End Date Taking? Authorizing Provider  amLODipine (NORVASC) 10 MG tablet Take 10 mg by mouth daily.    Historical Provider, MD  aspirin 325 MG tablet Take 325 mg by mouth daily.    Historical Provider, MD  atorvastatin (LIPITOR) 40 MG tablet Take 40 mg by mouth daily.    Historical Provider, MD  CALCIUM PO Take 1 tablet by mouth daily.    Historical Provider, MD  metoprolol succinate (TOPROL-XL) 50 MG 24 hr tablet Take 50 mg by mouth daily. Take with or immediately following a meal.    Historical Provider, MD  traMADol-acetaminophen (ULTRACET) 37.5-325 MG per  tablet Take 1 tablet by mouth daily.    Historical Provider, MD   BP 183/71 mmHg  Pulse 64  Resp 14  Wt 53.9 kg  SpO2 100% Physical Exam  Constitutional: She appears listless.  Elderly, frail  HENT:  Head: Normocephalic and atraumatic.  Mouth/Throat: Oropharynx is clear and moist.  Eyes: Conjunctivae are normal. Right eye exhibits no discharge. No scleral icterus.  Neck: No tracheal deviation present. No thyromegaly present.  Cardiovascular: Normal rate and regular rhythm.   Pulmonary/Chest: Effort normal and breath sounds normal. No respiratory distress. She has no wheezes.  Abdominal: Soft. Bowel sounds are normal. She exhibits no distension. There is no tenderness. There is no rebound and no guarding.  Musculoskeletal: She exhibits no edema or tenderness.  Neurological: She appears listless. She displays no tremor. Cranial nerve deficit: eft-sided facial droop, visual field cut on the left. She exhibits abnormal muscle tone. She displays no seizure activity. GCS eye subscore is 4. GCS verbal subscore is 4. GCS motor subscore is 6.  Left-sided hemiparesis no movement of the left arm or left leg, patient has good strength and movement of her right upper extremity and right lower extremity  Skin: She is not diaphoretic.    ED Course  Procedures (including  critical care time) Labs Review Labs Reviewed  I-STAT CHEM 8, ED - Abnormal; Notable for the following:    Potassium 3.3 (*)    Creatinine, Ser 1.10 (*)    Glucose, Bld 148 (*)    Hemoglobin 15.6 (*)    All other components within normal limits  PROTIME-INR  APTT  CBC  DIFFERENTIAL  COMPREHENSIVE METABOLIC PANEL  I-STAT TROPOININ, ED  CBG MONITORING, ED    Imaging Review Ct Head Wo Contrast  12/23/2014  CLINICAL DATA:  Left-sided paralysis. Fall this morning. Code stroke. History of hypertension and hyperlipidemia. EXAM: CT HEAD WITHOUT CONTRAST TECHNIQUE: Contiguous axial images were obtained from the base of the skull  through the vertex without intravenous contrast. COMPARISON:  None. FINDINGS: There is marked motion artifact on the original acquisition with mild artifact on the repeat. Within these limitations, there is no evidence of acute large territory infarct. No acute intracranial hemorrhage, mass, midline shift, or extra-axial fluid collection is identified. There is mild cerebral atrophy. Confluent subcortical and periventricular cerebral white matter hypodensities are nonspecific but compatible with rather extensive chronic small vessel ischemic disease. Prior bilateral cataract extraction is noted. Minimal mucosal thickening is noted in the paranasal sinuses, with a small osteoma noted in an anterior left ethmoid air cell. The visualized mastoid air cells are clear. No skull fracture is identified. Calcified atherosclerosis is noted at the skullbase. IMPRESSION: 1. Motion degraded examination without acute intracranial abnormality identified. 2. Extensive chronic small vessel ischemic disease. These results were called by telephone at the time of interpretation on 12/16/2014 at 10:17 am to Dr. Nicole Kindred, who verbally acknowledged these results. Electronically Signed   By: Logan Bores M.D.   On: 12/05/2014 10:18   I have personally reviewed and evaluated these images and lab results as part of my medical decision-making.   EKG Interpretation   Date/Time:  Tuesday December 31 2014 10:17:04 EST Ventricular Rate:  65 PR Interval:  196 QRS Duration: 81 QT Interval:  434 QTC Calculation: 451 R Axis:   61 Text Interpretation:  Sinus rhythm Consider left atrial enlargement  Borderline repolarization abnormality , similar to prior ECG Confirmed by  Thelma Lorenzetti  MD-J, Makhiya Coburn ([id number]) on 12/08/2014 10:24:38 AM      MDM   Final diagnoses:  Cerebrovascular accident (CVA) due to occlusion of cerebral artery ([place])    Patient's symptoms are concerning for an acute stroke with left-sided hemiparesis.  Patient's was last  seen normal this morning about 0915.  Dr. Nicole Kindred, neurology, is evaluating the patient in the emergency department.    Dorie Rank, MD 12/06/2014 1027

## 2014-12-31 NOTE — Procedures (Signed)
Dr. Nicole Kindred notified of patients SBP > 180 at 1230.  Has PRN Labetalol however HR is in 60's.  Recycled BP at 1245 was SBP 162.  Dr. Nicole Kindred will hold off in ordering additional BP meds and said to call back if SBP goes above parameters.

## 2014-12-31 NOTE — ED Notes (Signed)
Chaplain on his way.

## 2014-12-31 NOTE — Code Documentation (Signed)
81 year old female lives at home with daughter - functions independently including walking downtown and catching the bus.  Got up this morning and all was normal.  She came to the kitchen to get breakfast -was walking back to her room with bowl of oatmeal when the family heard her fall.  She then presented with left hemiplegia and left facial droop with severe dysarthria.  EMS was called and she was transported to ED.  Code stroke called in the field.  See flow sheet for times.  On arrival she is lethargic but arouses easily - severe dysarthria - left side flaccid - left gaze - follows commands - complete left field cut - left sensation loss - NIHHS 20.  Family present - speaking with Dr. Nicole Kindred.  tPA initiated at 1025.  Failed swallow screen - will need speech evaluation.  For ICU admit.  Orders written per Dr. Nicole Kindred. Tol tPA - no changes in neuro assessement.  More alert only.  Family at bedside.  Transferred to 3M11 at 64 with Sarah RN - no neuro changes.  Handoff to [employer].

## 2014-12-31 NOTE — Evaluation (Signed)
Clinical/Bedside Swallow Evaluation Patient Details  Name: Jenna Arnold MRN: [id number] Date of Birth: [birth year]  Today's Date: 12/27/2014 Time: SLP Start Time (ACUTE ONLY): 5 SLP Stop Time (ACUTE ONLY): 1618 SLP Time Calculation (min) (ACUTE ONLY): 15 min  Past Medical History:  Past Medical History  Diagnosis Date  . Hypertension   . Hyperlipidemia   . Arthritis   . Cataract   . Glaucoma   . Osteoporosis    Past Surgical History:  Past Surgical History  Procedure Laterality Date  . Eye surgery Bilateral 2012  . Lipoma excision Left 07/11/13    left flank   HPI:  Jenna Arnold is an 81 y.o. female with a history of hypertension, hyperlipidemia, arthritis, glaucoma and osteoporosis, brought to the emergency room and code stroke status following acute onset of left side weakness with garbled speech and facial droop. He was last seen well at 9:15 AM today. She has no previous history of stroke nor TIA. She's been taking aspirin daily. CT scan of her head showed no acute intracranial abnormality. NIH stroke score was 19. Patient was deemed a candidate for intravenous thrombolytic therapy with TPA, which was administered. Patient was subsequently admitted to neuro ICU for further management.   Assessment / Plan / Recommendation Clinical Impression  Pt presents with a neurogenic dysphagia and lethargy, all impacting safety with POs.  There are focal CN deficits on left, leading to decreased labial seal and manipulation of POs; swallow appears weak and likely delayed.  There are subtle signs of aspiration with consumption of thin liquids.  Pt required continuous prompting to remain alert for exam.  For today, recommend allowing meds crushed in puree; otherwise, remain NPO.  SLP will follow for improvements and PO readiness.  D/W daughter and Therapist, sports.     Aspiration Risk  Severe aspiration risk    Diet Recommendation     Medication Administration: Crushed with puree    Other   Recommendations Oral Care Recommendations: Oral care QID   Follow up Recommendations   (tba)    Frequency and Duration min 3x week  2 weeks       Prognosis Prognosis for Safe Diet Advancement: Fair      Swallow Study   General Date of Onset: 12/12/2014 HPI: Jenna Arnold is an 81 y.o. female with a history of hypertension, hyperlipidemia, arthritis, glaucoma and osteoporosis, brought to the emergency room and code stroke status following acute onset of left side weakness with garbled speech and facial droop. He was last seen well at 9:15 AM today. She has no previous history of stroke nor TIA. She's been taking aspirin daily. CT scan of her head showed no acute intracranial abnormality. NIH stroke score was 19. Patient was deemed a candidate for intravenous thrombolytic therapy with TPA, which was administered. Patient was subsequently admitted to neuro ICU for further management. Type of Study: Bedside Swallow Evaluation Previous Swallow Assessment: no Diet Prior to this Study: NPO Temperature Spikes Noted: No Respiratory Status: Room air History of Recent Intubation: No Behavior/Cognition: Lethargic/Drowsy Oral Cavity Assessment: Within Functional Limits Oral Care Completed by SLP: No Oral Cavity - Dentition: Edentulous Self-Feeding Abilities: Total assist Patient Positioning: Upright in bed Baseline Vocal Quality: Low vocal intensity Volitional Cough: Cognitively unable to elicit Volitional Swallow: Unable to elicit    Oral/Motor/Sensory Function Overall Oral Motor/Sensory Function: Moderate impairment Facial ROM: Reduced left;Suspected CN VII (facial) dysfunction Facial Symmetry: Abnormal symmetry left;Suspected CN VII (facial) dysfunction Facial Strength: Reduced left;Suspected CN  VII (facial) dysfunction Facial Sensation: Suspected CN V (Trigeminal) dysfunction Lingual ROM: Suspected CN XII (hypoglossal) dysfunction Lingual Symmetry: Suspected CN XII (hypoglossal)  dysfunction Lingual Strength: Suspected CN XII (hypoglossal) dysfunction   Ice Chips Ice chips: Impaired Presentation: Spoon Oral Phase Impairments: Reduced labial seal;Reduced lingual movement/coordination Oral Phase Functional Implications: Left anterior spillage;Prolonged oral transit;Oral residue Pharyngeal Phase Impairments: Multiple swallows   Thin Liquid Thin Liquid: Impaired Presentation: Spoon Oral Phase Impairments: Reduced labial seal;Reduced lingual movement/coordination Oral Phase Functional Implications: Left anterior spillage Pharyngeal  Phase Impairments: Suspected delayed Swallow;Multiple swallows;Throat Clearing - Immediate    Nectar Thick Nectar Thick Liquid: Not tested   Honey Thick Honey Thick Liquid: Not tested   Puree Puree: Impaired Presentation: Spoon Oral Phase Impairments: Reduced labial seal;Reduced lingual movement/coordination Oral Phase Functional Implications: Left lateral sulci pocketing;Prolonged oral transit Pharyngeal Phase Impairments: Suspected delayed Swallow   Solid Solid: Not tested       Jenna Arnold 12/27/2014,4:26 PM

## 2014-12-31 NOTE — ED Notes (Signed)
Per ems- Pt lives at home walked out into kitchen to get something to eat, family heard her fall. EMS arrived with pt having left side paralysis and left sided facial droop. Pt following commands, speech severe slurring. LSN 0915, BP 145/100, CBG 130, HR 50-60, 98% RA.

## 2014-12-31 NOTE — H&P (Signed)
Admission H&P    Chief Complaint: Acute onset of left facial droop garbled speech and left hemiplegia.  HPI: Jenna Arnold is an 81 y.o. female with a history of hypertension, hyperlipidemia, arthritis, glaucoma and osteoporosis, brought to the emergency room and code stroke status following acute onset of left side weakness with garbled speech and facial droop. He was last seen well at 9:15 AM today. She has no previous history of stroke nor TIA. She's been taking aspirin daily. CT scan of her head showed no acute intracranial abnormality. NIH stroke score was 19. Patient was deemed a candidate for intravenous thrombolytic therapy with TPA, which was administered. Patient was subsequently admitted to neuro ICU for further management.  LSN: 9:15 AM on 12/19/2014 tPA Given: Yes mRankin:  Past Medical History  Diagnosis Date  . Hypertension   . Hyperlipidemia   . Arthritis   . Cataract   . Glaucoma   . Osteoporosis     Past Surgical History  Procedure Laterality Date  . Eye surgery Bilateral 2012  . Lipoma excision Left 07/11/13    left flank    Family history: Obtained and was noncontributory.  Social History:  reports that she has never smoked. Her smokeless tobacco use includes Snuff. She reports that she drinks alcohol. She reports that she does not use illicit drugs.  Allergies: No Known Allergies  Medications: Patient's preadmission medications were reviewed by me.  ROS: History obtained from patient's daughters.  General ROS: negative for - chills, fatigue, fever, night sweats, weight gain or weight loss Psychological ROS: negative for - behavioral disorder, hallucinations, memory difficulties, mood swings or suicidal ideation Ophthalmic ROS: negative for - blurry vision, double vision, eye pain or loss of vision ENT ROS: negative for - epistaxis, nasal discharge, oral lesions, sore throat, tinnitus or vertigo Allergy and Immunology ROS: negative for - hives or  itchy/watery eyes Hematological and Lymphatic ROS: negative for - bleeding problems, bruising or swollen lymph nodes Endocrine ROS: negative for - galactorrhea, hair pattern changes, polydipsia/polyuria or temperature intolerance Respiratory ROS: negative for - cough, hemoptysis, shortness of breath or wheezing Cardiovascular ROS: negative for - chest pain, dyspnea on exertion, edema or irregular heartbeat Gastrointestinal ROS: negative for - abdominal pain, diarrhea, hematemesis, nausea/vomiting or stool incontinence Genito-Urinary ROS: negative for - dysuria, hematuria, incontinence or urinary frequency/urgency Musculoskeletal ROS: negative for - joint swelling or muscular weakness Neurological ROS: as noted in HPI Dermatological ROS: negative for rash and skin lesion changes  Physical Examination: Blood pressure 181/78, pulse 34, resp. rate 16, weight 53.9 kg (118 lb 13.3 oz), SpO2 100 %.  HEENT-  Normocephalic, no lesions, without obvious abnormality.  Normal external eye and conjunctiva.  Normal TM's bilaterally.  Normal auditory canals and external ears. Normal external nose, mucus membranes and septum.  Normal pharynx. Neck supple with no masses, nodes, nodules or enlargement. Cardiovascular - regular rate and rhythm with occasional premature beat; 2/6 systolic murmur heard best at the second right intercostal space; no S3 or S4. Lungs - chest clear, no wheezing, rales, normal symmetric air entry Abdomen - soft, non-tender; bowel sounds normal; no masses,  no organomegaly Extremities - no joint deformities, effusion, or inflammation and no edema  Neurologic Examination: Mental Status: Alert, markedly dysarthric speech. Able to follow commands use of right extremities. Neglect of left side noted. Cranial Nerves: II-dense left homonymous hemianopsia. III/IV/VI-Pupils were equal and reacted. Extraocular movements abnormal with eyes deviated conjugately to the right side.    V/VII-no  detection of tactile sensation on left side of the face; moderately severe left lower facial weakness. Threasa Beards dysarthric speech. XI: trapezius strength/neck flexion strength normal bilaterally XII-midline tongue extension with normal strength. Motor: Flaccid left hemiplegia; normal strength and tone of right extremities. Sensory: Normal throughout. Deep Tendon Reflexes: 1+ and symmetric. Plantars: Flexor on the right and extensor on the left Cerebellar: Normal finger-to-nose testing with use of right upper extremity. Carotid auscultation: Normal  Results for orders placed or performed during the hospital encounter of 12/20/2014 (from the past 48 hour(s))  Protime-INR     Status: None   Collection Time: 12/10/2014 10:02 AM  Result Value Ref Range   Prothrombin Time 13.8 11.6 - 15.2 seconds   INR 1.04 0.00 - 1.49  APTT     Status: None   Collection Time: 12/24/2014 10:02 AM  Result Value Ref Range   aPTT 27 24 - 37 seconds  CBC     Status: None   Collection Time: 12/21/2014 10:02 AM  Result Value Ref Range   WBC 5.7 4.0 - 10.5 K/uL   RBC 4.38 3.87 - 5.11 MIL/uL   Hemoglobin 12.5 12.0 - 15.0 g/dL   HCT 38.9 36.0 - 46.0 %   MCV 88.8 78.0 - 100.0 fL   MCH 28.5 26.0 - 34.0 pg   MCHC 32.1 30.0 - 36.0 g/dL   RDW 14.6 11.5 - 15.5 %   Platelets 211 150 - 400 K/uL  Differential     Status: None   Collection Time: 12/26/2014 10:02 AM  Result Value Ref Range   Neutrophils Relative % 34 %   Neutro Abs 1.9 1.7 - 7.7 K/uL   Lymphocytes Relative 53 %   Lymphs Abs 3.0 0.7 - 4.0 K/uL   Monocytes Relative 10 %   Monocytes Absolute 0.6 0.1 - 1.0 K/uL   Eosinophils Relative 4 %   Eosinophils Absolute 0.2 0.0 - 0.7 K/uL   Basophils Relative 1 %   Basophils Absolute 0.0 0.0 - 0.1 K/uL  Comprehensive metabolic panel     Status: Abnormal   Collection Time: 12/12/2014 10:02 AM  Result Value Ref Range   Sodium 138 135 - 145 mmol/L   Potassium 3.5 3.5 - 5.1 mmol/L   Chloride 105 101 - 111  mmol/L   CO2 20 (L) 22 - 32 mmol/L   Glucose, Bld 154 (H) 65 - 99 mg/dL   BUN 15 6 - 20 mg/dL   Creatinine, Ser 1.15 (H) 0.44 - 1.00 mg/dL   Calcium 9.5 8.9 - 10.3 mg/dL   Total Protein 7.6 6.5 - 8.1 g/dL   Albumin 4.1 3.5 - 5.0 g/dL   AST 18 15 - 41 U/L   ALT 12 (L) 14 - 54 U/L   Alkaline Phosphatase 79 38 - 126 U/L   Total Bilirubin 0.8 0.3 - 1.2 mg/dL   GFR calc non Af Amer 40 (L) >60 mL/min   GFR calc Af Amer 46 (L) >60 mL/min    Comment: (NOTE) The eGFR has been calculated using the CKD EPI equation. This calculation has not been validated in all clinical situations. eGFR's persistently <60 mL/min signify possible Chronic Kidney Disease.    Anion gap 13 5 - 15  I-stat troponin, ED (not at [facility], [facility])     Status: None   Collection Time: 12/25/2014 10:09 AM  Result Value Ref Range   Troponin i, poc 0.00 0.00 - 0.08 ng/mL   Comment 3  Comment: Due to the release kinetics of cTnI, a negative result within the first hours of the onset of symptoms does not rule out myocardial infarction with certainty. If myocardial infarction is still suspected, repeat the test at appropriate intervals.   I-Stat Chem 8, ED  (not at [facility], [facility])     Status: Abnormal   Collection Time: 12/26/2014 10:11 AM  Result Value Ref Range   Sodium 140 135 - 145 mmol/L   Potassium 3.3 (L) 3.5 - 5.1 mmol/L   Chloride 105 101 - 111 mmol/L   BUN 16 6 - 20 mg/dL   Creatinine, Ser 1.10 (H) 0.44 - 1.00 mg/dL   Glucose, Bld 148 (H) 65 - 99 mg/dL   Calcium, Ion 1.16 1.13 - 1.30 mmol/L   TCO2 22 0 - 100 mmol/L   Hemoglobin 15.6 (H) 12.0 - 15.0 g/dL   HCT 46.0 36.0 - 46.0 %   Ct Head Wo Contrast  01/02/2015  CLINICAL DATA:  Left-sided paralysis. Fall this morning. Code stroke. History of hypertension and hyperlipidemia. EXAM: CT HEAD WITHOUT CONTRAST TECHNIQUE: Contiguous axial images were obtained from the base of the skull through the vertex without intravenous contrast. COMPARISON:  None. FINDINGS:  There is marked motion artifact on the original acquisition with mild artifact on the repeat. Within these limitations, there is no evidence of acute large territory infarct. No acute intracranial hemorrhage, mass, midline shift, or extra-axial fluid collection is identified. There is mild cerebral atrophy. Confluent subcortical and periventricular cerebral white matter hypodensities are nonspecific but compatible with rather extensive chronic small vessel ischemic disease. Prior bilateral cataract extraction is noted. Minimal mucosal thickening is noted in the paranasal sinuses, with a small osteoma noted in an anterior left ethmoid air cell. The visualized mastoid air cells are clear. No skull fracture is identified. Calcified atherosclerosis is noted at the skullbase. IMPRESSION: 1. Motion degraded examination without acute intracranial abnormality identified. 2. Extensive chronic small vessel ischemic disease. These results were called by telephone at the time of interpretation on 12/30/2014 at 10:17 am to Dr. Nicole Kindred, who verbally acknowledged these results. Electronically Signed   By: Logan Bores M.D.   On: 12/17/2014 10:18    Assessment: 81 y.o. female with multiple risk factors for stroke presenting with acute right MCA territory ischemic stroke.  Stroke Risk Factors - hyperlipidemia and hypertension  Plan: 1. HgbA1c, fasting lipid panel 2. MRI, MRA  of the brain without contrast 3. PT consult, OT consult, Speech consult 4. Echocardiogram 5. Carotid dopplers 6. Prophylactic therapy-Antiplatelet med: Aspirin  7. Risk factor modification 8. Telemetry monitoring  This patient is critically ill and at significant risk of neurological worsening or death, and care requires constant monitoring of vital signs, hemodynamics,respiratory and cardiac monitoring, neurological assessment, discussion with family, other specialists and medical decision making of high complexity. Total critical care time  was 60 minutes.  C.R. Nicole Kindred, MD [facility] [phone]  12/08/2014, 10:41 AM

## 2014-12-31 NOTE — ED Notes (Signed)
Family at bedside. 

## 2014-12-31 NOTE — Progress Notes (Signed)
  Echocardiogram 2D Echocardiogram has been performed.  Jenna Arnold 12/05/2014, 1:06 PM

## 2015-01-01 ENCOUNTER — Inpatient Hospital Stay (HOSPITAL_COMMUNITY): Payer: Medicare Other

## 2015-01-01 DIAGNOSIS — G936 Cerebral edema: Secondary | ICD-10-CM

## 2015-01-01 DIAGNOSIS — F17200 Nicotine dependence, unspecified, uncomplicated: Secondary | ICD-10-CM

## 2015-01-01 DIAGNOSIS — I63131 Cerebral infarction due to embolism of right carotid artery: Secondary | ICD-10-CM

## 2015-01-01 DIAGNOSIS — E785 Hyperlipidemia, unspecified: Secondary | ICD-10-CM

## 2015-01-01 DIAGNOSIS — I10 Essential (primary) hypertension: Secondary | ICD-10-CM

## 2015-01-01 LAB — VITAMIN B12: Vitamin B-12: 442 pg/mL (ref 180–914)

## 2015-01-01 LAB — LIPID PANEL
CHOL/HDL RATIO: 2 ratio
CHOLESTEROL: 170 mg/dL (ref 0–200)
HDL: 85 mg/dL (ref >40)
LDL Cholesterol: 79 mg/dL (ref 0–99)
Triglycerides: 30 mg/dL (ref <150)
VLDL: 6 mg/dL (ref 0–40)

## 2015-01-01 LAB — CBC
HCT: 39 % (ref 36.0–46.0)
Hemoglobin: 12.7 g/dL (ref 12.0–15.0)
MCH: 28.2 pg (ref 26.0–34.0)
MCHC: 32.6 g/dL (ref 30.0–36.0)
MCV: 86.7 fL (ref 78.0–100.0)
PLATELETS: 232 10*3/uL (ref 150–400)
RBC: 4.5 MIL/uL (ref 3.87–5.11)
RDW: 14.8 % (ref 11.5–15.5)
WBC: 8.3 10*3/uL (ref 4.0–10.5)

## 2015-01-01 LAB — BASIC METABOLIC PANEL
Anion gap: 9 (ref 5–15)
BUN: 12 mg/dL (ref 6–20)
CHLORIDE: 109 mmol/L (ref 101–111)
CO2: 22 mmol/L (ref 22–32)
Calcium: 9.4 mg/dL (ref 8.9–10.3)
Creatinine, Ser: 0.98 mg/dL (ref 0.44–1.00)
GFR calc Af Amer: 56 mL/min — ABNORMAL LOW (ref >60)
GFR, EST NON AFRICAN AMERICAN: 49 mL/min — AB (ref >60)
GLUCOSE: 153 mg/dL — AB (ref 65–99)
POTASSIUM: 3.7 mmol/L (ref 3.5–5.1)
Sodium: 140 mmol/L (ref 135–145)

## 2015-01-01 LAB — TSH: TSH: 0.697 u[IU]/mL (ref 0.350–4.500)

## 2015-01-01 MED ORDER — MORPHINE SULFATE (PF) 2 MG/ML IV SOLN
1.0000 mg | INTRAVENOUS | Status: DC | PRN
  Administered 2015-01-03: 1 mg via INTRAVENOUS
  Filled 2015-01-01: qty 1

## 2015-01-01 MED ORDER — HALOPERIDOL LACTATE 2 MG/ML PO CONC
0.5000 mg | ORAL | Status: DC | PRN
  Filled 2015-01-01: qty 0.3

## 2015-01-01 MED ORDER — HALOPERIDOL 1 MG PO TABS
0.5000 mg | ORAL_TABLET | ORAL | Status: DC | PRN
  Filled 2015-01-01: qty 1

## 2015-01-01 MED ORDER — ASPIRIN 300 MG RE SUPP: 300.0000 mg | Freq: Every day | RECTAL | Status: DC

## 2015-01-01 MED ORDER — BIOTENE DRY MOUTH MT LIQD: 15.0000 mL | OROMUCOSAL | Status: DC | PRN

## 2015-01-01 MED ORDER — HALOPERIDOL LACTATE 5 MG/ML IJ SOLN: 0.5000 mg | INTRAMUSCULAR | Status: DC | PRN

## 2015-01-01 MED ORDER — GLYCOPYRROLATE 0.2 MG/ML IJ SOLN
0.2000 mg | INTRAMUSCULAR | Status: DC | PRN
  Administered 2015-01-03: 0.2 mg via SUBCUTANEOUS

## 2015-01-01 MED ORDER — GLYCOPYRROLATE 0.2 MG/ML IJ SOLN
0.2000 mg | INTRAMUSCULAR | Status: DC | PRN
  Filled 2015-01-01: qty 1

## 2015-01-01 MED ORDER — LORAZEPAM 2 MG/ML PO CONC: 1.0000 mg | ORAL | Status: DC | PRN

## 2015-01-01 MED ORDER — GLYCOPYRROLATE 1 MG PO TABS
1.0000 mg | ORAL_TABLET | ORAL | Status: DC | PRN
  Filled 2015-01-01: qty 1

## 2015-01-01 MED ORDER — POTASSIUM CHLORIDE 10 MEQ/100ML IV SOLN
10.0000 meq | INTRAVENOUS | Status: DC
  Administered 2015-01-01 (×2): 10 meq via INTRAVENOUS
  Filled 2015-01-01 (×5): qty 100

## 2015-01-01 MED ORDER — ONDANSETRON HCL 4 MG/2ML IJ SOLN: 4.0000 mg | Freq: Four times a day (QID) | INTRAMUSCULAR | Status: DC | PRN

## 2015-01-01 MED ORDER — ONDANSETRON 4 MG PO TBDP
4.0000 mg | ORAL_TABLET | Freq: Four times a day (QID) | ORAL | Status: DC | PRN
  Filled 2015-01-01: qty 1

## 2015-01-01 MED ORDER — LORAZEPAM 2 MG/ML IJ SOLN: 1.0000 mg | INTRAMUSCULAR | Status: DC | PRN

## 2015-01-01 MED ORDER — DIPHENHYDRAMINE HCL 50 MG/ML IJ SOLN: 12.5000 mg | INTRAMUSCULAR | Status: DC | PRN

## 2015-01-01 MED ORDER — LORAZEPAM 1 MG PO TABS: 1.0000 mg | ORAL_TABLET | ORAL | Status: DC | PRN

## 2015-01-01 MED ORDER — POLYVINYL ALCOHOL 1.4 % OP SOLN: 1.0000 [drp] | Freq: Four times a day (QID) | OPHTHALMIC | Status: DC | PRN

## 2015-01-01 MED ORDER — IOHEXOL 350 MG/ML SOLN
50.0000 mL | Freq: Once | INTRAVENOUS | Status: AC | PRN
  Administered 2015-01-01: 50 mL via INTRAVENOUS

## 2015-01-01 NOTE — Progress Notes (Signed)
OT Cancellation Note  Patient Details Name: Jenna Arnold MRN: [id number] DOB: Jul 13, 1920   Cancelled Treatment:    Reason Eval/Treat Not Completed: Patient not medically ready (BEDREST - less responsive at 1700 pm per notes) OT order received and appreciated however this conflicts with current bedrest order set. Please increase activity tolerance as appropriate and remove bedrest from orders. .. OT will hold evaluation at this time and will check back as time allows pending increased activity orders.   Peri Maris  [id number] 01/01/2015, 7:06 AM

## 2015-01-01 NOTE — Progress Notes (Signed)
STROKE TEAM PROGRESS NOTE   HISTORY Jenna Arnold is an 81 y.o. female with a history of hypertension, hyperlipidemia, arthritis, glaucoma and osteoporosis, brought to the emergency room and code stroke status following acute onset of left side weakness with garbled speech and facial droop. He was last seen well at 9:15 AM today 12/23/2014 ([facility]). She has no previous history of stroke nor TIA. She's been taking aspirin daily. CT scan of her head showed no acute intracranial abnormality. NIH stroke score was 19. Patient was deemed a candidate for intravenous thrombolytic therapy with TPA, which was administered. Patient was subsequently admitted to neuro ICU for further management.   SUBJECTIVE (INTERVAL HISTORY) Her 2 daughters are at the bedside.  They report she was fulling independent prior to admission - planting flowers, exercising, walking to the hairdresser, to the bus stop, etc. Overall they feel her condition is worse compared to yesterday. 57rd and oldest sister arrived during rounds. I talked with her as well.   OBJECTIVE Temp:  [95.6 F (35.3 C)-99.1 F (37.3 C)] 96.8 F (36 C) (12/28 0800) Pulse Rate:  [34-130] 77 (12/28 0700) Cardiac Rhythm:  [-] Normal sinus rhythm (12/28 0700) Resp:  [14-45] 18 (12/28 0700) BP: (143-201)/(60-115) 156/75 mmHg (12/28 0700) SpO2:  [92 %-100 %] 100 % (12/28 0700) Weight:  [53.9 kg (118 lb 13.3 oz)-63.3 kg (139 lb 8.8 oz)] 63.3 kg (139 lb 8.8 oz) (12/27 1145)  CBC:   Recent Labs Lab 12/26/2014 1002 12/30/2014 1011 01/01/15 0830  WBC 5.7  --  8.3  NEUTROABS 1.9  --   --   HGB 12.5 15.6* 12.7  HCT 38.9 46.0 39.0  MCV 88.8  --  86.7  PLT 211  --  [id number]    Basic Metabolic Panel:   Recent Labs Lab 12/22/2014 1002 01/01/2015 1011 01/01/15 0830  NA 138 140 140  K 3.5 3.3* 3.7  CL 105 105 109  CO2 20*  --  22  GLUCOSE 154* 148* 153*  BUN 15 16 12   CREATININE 1.15* 1.10* 0.98  CALCIUM 9.5  --  9.4    Lipid Panel:     Component Value  Date/Time   CHOL 170 01/01/2015 0212   TRIG 30 01/01/2015 0212   HDL 85 01/01/2015 0212   CHOLHDL 2.0 01/01/2015 0212   VLDL 6 01/01/2015 0212   LDLCALC 79 01/01/2015 0212   HgbA1c: No results found for: HGBA1C Urine Drug Screen: No results found for: LABOPIA, COCAINSCRNUR, LABBENZ, AMPHETMU, THCU, LABBARB    IMAGING I have personally reviewed the radiological images below and agree with the radiology interpretations.  Ct Head Wo Contrast 12/18/2014   No post tPA hemorrhage is observed. Evolving cytotoxic edema throughout the RIGHT MCA territory, notably in the RIGHT frontal and temporal cortex, insula, as well as the RIGHT lentiform nucleus and adjacent white matter. Cannot exclude large vessel occlusion in the distal M1 proximal M2 regions, RIGHT MCA.   Ct Head Wo Contrast 12/30/2014  1. Motion degraded examination without acute intracranial abnormality identified. 2. Extensive chronic small vessel ischemic disease.  Dg Chest Port 1 View 01/01/2015  Small area of infiltrate lateral left base, suspicious for pneumonia. Lungs elsewhere clear. Heart borderline enlarged. Calcification of uncertain location and etiology in the left upper quadrant region measuring 4.2 x 3.7 cm.   Ct Angio Neck and head W/cm &/or Wo/cm 01/01/2015   IMPRESSION: 1. Emergent large vessel occlusions: Occluded right ICA siphon, right M1 segment, and right A1 segment. 2. Poor  right MCA reconstitution/collaterals. The left ACA likely was anatomically supplied from the right, with moderate to poor bilateral ACA flow. 3. Occluded distal right vertebral artery V4 segment, appears to be chronic. 4. Multifocal high-grade stenoses in the dominant left vertebral artery V4 segment and basilar artery. 5. Negative extracranial carotid arteries aside from tortuosity. 6. Extensive right hemisphere cytotoxic edema but no midline shift or hemorrhagic transformation at this time.    Mr Brain Wo Contrast  01/01/2015  IMPRESSION:  Acute infarction affecting the right anterior and middle cerebral artery territories and portions of the left anterior cerebral artery territory. Swelling with mild mass effect, 3 mm right to left shift. No hemorrhage at this time.   Carotid Doppler   1-39% left internal carotid artery stenosis. The left vertebral artery is patent with antegrade flow. The right internal carotid artery exhibits atypical spiked/thumped waveforms suggestive of a distal obstruction. The right vertebral artery was difficult to visualize, however there is evidence of thumped waveforms, suggestive of possible distal obstruction.  2D Echocardiogram  - Left ventricle: The cavity size was normal. There was mild focalbasal hypertrophy of the septum. Systolic function was normal.The estimated ejection fraction was in the range of 55% to 60%.Wall motion was normal; there were no regional wall motionabnormalities. Doppler parameters are consistent with abnormalleft ventricular relaxation (grade 1 diastolic dysfunction). - Aortic valve: There was trivial regurgitation. Impressions:  Normal LV systolic function; grade 1 diastolic dysfunction;sclerotic aortic valve; trace AI, MR and TR.   PHYSICAL EXAM  Temp:  [96.8 F (36 C)-99.1 F (37.3 C)] 96.8 F (36 C) (12/28 1200) Pulse Rate:  [73-93] 93 (12/28 1400) Resp:  [17-45] 31 (12/28 1400) BP: (143-189)/(60-96) 189/96 mmHg (12/28 1400) SpO2:  [92 %-100 %] 98 % (12/28 1400)  General - Well nourished, well developed, in no apparent distress but unresponsive lying in bed.  Ophthalmologic - Fundi not visualized due to difficulty positioning.  Cardiovascular - Regular rate and rhythm.  Neuro - unresponsive to voice, not open eyes on pain stimulation, PERRL, eyes deviated to the right, not able to cross midline with dolls eye maneuver. Positive corneal and gag. Left facial droop, right UE localizing to painful stimuli, left UE minimal withdraw on pain, left LE 3-/5 on pain  stimulation, R LE 3/5 on painful stimulation. Left babinski positive. DTR 1+. Sensation coordination and gait not tested.   ASSESSMENT/PLAN Ms. Jenna Arnold is a 81 y.o. female with history of hypertension, hyperlipidemia, arthritis, glaucoma and osteoporosis presenting with left facial droop garbled speech and left hemiplegia. She received IV t-PA 12/27/2014 at 1025.   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 Right V4 occlusion.  Carotid Doppler  R distal ICA occlusion, R VA distal occlusion  2D Echo EF 55-60%, No source of embolus   LDL 79  HgbA1c pending Diet NPO time specified  aspirin 325 mg daily prior to admission, now on No antithrombotic as within 24h of tPA.  Cerebral edema  Large right MCA and ACA and partial left ACA stroke  Early cerebral edema on MRI and CT  Expected severe cerebral edema down the road with potential brain herniation  Poor prognosis given age and images  Discussed  with family extensively and they all agree with comfort care and DNR  Will put in orders and transfer to [facility]  Hypertension  Elevated   At 0501 180/74  Treatment within post tPA guidelines - received IV hydralazine three times since admission, last at [facility] care measure at this time  Hyperlipidemia  Home meds:  zocor 40  LDL 79, goal < 70  Other Stroke Risk Factors  Advanced age  Smokeless tobacco (snuff)  ETOH use  Other Active Problems  Hypokalemia, K 3.3. Replaced  Renal insufficiency, Cr 1.1  Hospital day # 1  This patient is critically ill due to extensive right hemisphere infarcts and at significant risk of neurological worsening,  death form cerebral edema and brain herniation and cardiopulmonary arrest. This patient's care requires constant monitoring of vital signs, hemodynamics, respiratory and cardiac monitoring, review of multiple databases, neurological assessment, discussion with family, other specialists and medical decision making of high complexity. I spent 45 minutes of neurocritical care time in the care of this patient.  Had extensive discussion with daughters at bedside in am and pm, showed them brain images and discussed with current diagnosis and treatment options and prognosis. Family are in unanimous decision (as per patient wishes) that they want comfort care given the nature of the condition is serious and terminal. As per family wishes we will initiate comfort care and stop any life prolonging therapies. I agree with DNR and comfort care.  Rosalin Hawking, MD PhD Stroke Neurology 01/01/2015 3:21 PM      To contact Stroke Continuity provider, please refer to [email or web address] After hours, contact General Neurology

## 2015-01-01 NOTE — Progress Notes (Signed)
PT Cancellation Note  Patient Details Name: Jenna Arnold MRN: [id number] DOB: 08/17/1920   Cancelled Treatment:    Reason Eval/Treat Not Completed: Patient not medically ready.  Pt currently on bedrest post-tpa and noted less responsive for RN yesterday pm.  Will hold PT and mobility at this time and need activity orders updated once appropriate for PT and mobility.     Cate Oravec, Thornton Papas 01/01/2015, 8:24 AM

## 2015-01-01 NOTE — Progress Notes (Signed)
Received pt to 6n14 at this time.

## 2015-01-01 NOTE — Clinical Documentation Improvement (Signed)
Neurology  Based on the clinical findings below, please document any associated diagnoses/conditions the patient has or may have.        Cerebral Edema  Other  Clinically Undetermined  Supporting Information:  CT Head 12/07/2014 FINDINGS: Evolving cytotoxic edema throughout the RIGHT MCA territory, particularly the RIGHT frontal and temporal cortex, insula, as well as the RIGHT lentiform nucleus and adjacent white matter. Within limits for detection on this scan, no visible hemorrhage.  Please exercise your independent, professional judgment when responding. A specific answer is not anticipated or expected.  Please update your documentation within the medical record to reflect your response to this query. Thank you.  Thank You, Alessandra Grout, RN, BSN, CCDS,Clinical Documentation Specialist:  [phone]  [phone]=[place]- Health Information Management

## 2015-01-01 NOTE — Care Management Note (Signed)
Case Management Note  Patient Details  Name: ANZAL COFFEY MRN: [id number] Date of Birth: 23-Mar-1920  Subjective/Objective:  Pt admitted on 12/09/2014 with CVA; pt was given TPA.  PTA, pt resided at home and was cared for by family.                    Action/Plan: Will follow for discharge planning as pt progresses.  Will need PT/OT evaluations when medically able to tolerate.     Expected Discharge Date:                  Expected Discharge Plan:  Skilled Nursing Facility  In-House Referral:  Clinical Social Work  Discharge planning Services  CM Consult  Post Acute Care Choice:    Choice offered to:     DME Arranged:    DME Agency:     HH Arranged:    [place] Agency:     Status of Service:  In process, will continue to follow  Medicare Important Message Given:    Date Medicare IM Given:    Medicare IM give by:    Date Additional Medicare IM Given:    Additional Medicare Important Message give by:     If discussed at [place] of Stay Meetings, dates discussed:    Additional Comments:  Reinaldo Raddle, RN, BSN  Trauma/Neuro ICU Case Manager [phone]

## 2015-01-01 NOTE — Progress Notes (Signed)
SLP Cancellation Note  Patient Details Name: Jenna Arnold MRN: [id number] DOB: 1920/03/29   Cancelled treatment:       Reason Eval/Treat Not Completed: less responsive today.  SLP will follow.     Juan Quam Laurice 01/01/2015, 12:04 PM

## 2015-01-01 NOTE — Progress Notes (Signed)
Initial Nutrition Assessment  DOCUMENTATION CODES:   Not applicable  INTERVENTION:   Advance diet vs initiate EN support as medically appropriate  NUTRITION DIAGNOSIS:   Inadequate oral intake related to inability to eat as evidenced by NPO status  GOAL:   Patient will meet greater than or equal to 90% of their needs  MONITOR:   Diet advancement, PO intake, Labs, Weight trends, I & O's  REASON FOR ASSESSMENT:   Low Braden  ASSESSMENT:   81 y.o. Female with a history of hypertension, hyperlipidemia, arthritis, glaucoma and osteoporosis, brought to the emergency room and code stroke status following acute onset of left side weakness with garbled speech and facial droop.    Patient unresponsive.  Neurology note reviewed.  Pt with non-dominant R MCA infarct secondary to occluded R ICA, embolic due to unknown source.  No family at bedside.    Low braden score places patient at risk for skin breakdown.  RD unable to complete Nutrition Focused Physical Exam at this time.   Diet Order:  Diet NPO time specified  Skin:  Reviewed, no issues  Last BM:  N/A  Height:   Ht Readings from Last 1 Encounters:  12/13/2014 5\' 5"  (1.651 m)    Weight:   Wt Readings from Last 1 Encounters:  12/18/2014 139 lb 8.8 oz (63.3 kg)    Ideal Body Weight:  56.8 kg  BMI:  Body mass index is 23.22 kg/(m^2).  Estimated Nutritional Needs:   Kcal:  1400-1600  Protein:  60-70 gm  Fluid:  >/= 1.5 L  EDUCATION NEEDS:   No education needs identified at this time  Arthur Holms, RD, LDN Pager #: [phone] After-Hours Pager #: [phone]

## 2015-01-02 DIAGNOSIS — Z515 Encounter for palliative care: Secondary | ICD-10-CM

## 2015-01-02 LAB — HEMOGLOBIN A1C
HEMOGLOBIN A1C: 6.3 % — AB (ref 4.8–5.6)
MEAN PLASMA GLUCOSE: 134 mg/dL

## 2015-01-02 MED ORDER — CETYLPYRIDINIUM CHLORIDE 0.05 % MT LIQD
7.0000 mL | Freq: Two times a day (BID) | OROMUCOSAL | Status: DC
  Administered 2015-01-02 – 2015-01-03 (×2): 7 mL via OROMUCOSAL

## 2015-01-02 NOTE — Progress Notes (Signed)
Patient pressure is extremely high this morning. Spoke with charge nurse concerning results- patient is comfort care at this time. Will continue to monitor

## 2015-01-02 NOTE — Progress Notes (Signed)
Nutrition Brief Note  Chart reviewed. Pt now transitioning to comfort care.  No further nutrition interventions warranted at this time.  Please re-consult as needed.   Jeronimo Hellberg A. Manvi Guilliams, RD, LDN, CDE Pager: [phone] After hours Pager: [phone]  

## 2015-01-02 NOTE — Progress Notes (Signed)
STROKE TEAM PROGRESS NOTE   SUBJECTIVE (INTERVAL HISTORY) Her families are at the bedside. She moved to [facility] for palliative care. She is unresponsive but appears not to be in distress.    OBJECTIVE Temp:  [97.6 F (36.4 C)-98.6 F (37 C)] 98.6 F (37 C) (12/29 0555) Pulse Rate:  [81-116] 116 (12/29 0555) Cardiac Rhythm:  [-]  Resp:  [20-31] 20 (12/29 0555) BP: (189-204)/(74-96) 204/86 mmHg (12/29 0555) SpO2:  [95 %-98 %] 95 % (12/29 0555)  CBC:   Recent Labs Lab 12/07/2014 1002 12/26/2014 1011 01/01/15 0830  WBC 5.7  --  8.3  NEUTROABS 1.9  --   --   HGB 12.5 15.6* 12.7  HCT 38.9 46.0 39.0  MCV 88.8  --  86.7  PLT 211  --  [id number]    Basic Metabolic Panel:   Recent Labs Lab 12/28/2014 1002 12/21/2014 1011 01/01/15 0830  NA 138 140 140  K 3.5 3.3* 3.7  CL 105 105 109  CO2 20*  --  22  GLUCOSE 154* 148* 153*  BUN 15 16 12   CREATININE 1.15* 1.10* 0.98  CALCIUM 9.5  --  9.4    Lipid Panel:     Component Value Date/Time   CHOL 170 01/01/2015 0212   TRIG 30 01/01/2015 0212   HDL 85 01/01/2015 0212   CHOLHDL 2.0 01/01/2015 0212   VLDL 6 01/01/2015 0212   LDLCALC 79 01/01/2015 0212   HgbA1c:  Lab Results  Component Value Date   HGBA1C 6.3* 01/01/2015   Urine Drug Screen: No results found for: LABOPIA, COCAINSCRNUR, LABBENZ, AMPHETMU, THCU, LABBARB    IMAGING I have personally reviewed the radiological images below and agree with the radiology interpretations.  Ct Head Wo Contrast 12/09/2014   No post tPA hemorrhage is observed. Evolving cytotoxic edema throughout the RIGHT MCA territory, notably in the RIGHT frontal and temporal cortex, insula, as well as the RIGHT lentiform nucleus and adjacent white matter. Cannot exclude large vessel occlusion in the distal M1 proximal M2 regions, RIGHT MCA.   Ct Head Wo Contrast 12/16/2014  1. Motion degraded examination without acute intracranial abnormality identified. 2. Extensive chronic small vessel ischemic disease.  Dg  Chest Port 1 View 01/01/2015  Small area of infiltrate lateral left base, suspicious for pneumonia. Lungs elsewhere clear. Heart borderline enlarged. Calcification of uncertain location and etiology in the left upper quadrant region measuring 4.2 x 3.7 cm.   Ct Angio Neck and head W/cm &/or Wo/cm 01/01/2015   IMPRESSION: 1. Emergent large vessel occlusions: Occluded right ICA siphon, right M1 segment, and right A1 segment. 2. Poor right MCA reconstitution/collaterals. The left ACA likely was anatomically supplied from the right, with moderate to poor bilateral ACA flow. 3. Occluded distal right vertebral artery V4 segment, appears to be chronic. 4. Multifocal high-grade stenoses in the dominant left vertebral artery V4 segment and basilar artery. 5. Negative extracranial carotid arteries aside from tortuosity. 6. Extensive right hemisphere cytotoxic edema but no midline shift or hemorrhagic transformation at this time.    Mr Brain Wo Contrast  01/01/2015  IMPRESSION: Acute infarction affecting the right anterior and middle cerebral artery territories and portions of the left anterior cerebral artery territory. Swelling with mild mass effect, 3 mm right to left shift. No hemorrhage at this time.   Carotid Doppler   1-39% left internal carotid artery stenosis. The left vertebral artery is patent with antegrade flow. The right internal carotid artery exhibits atypical spiked/thumped waveforms suggestive of a distal  obstruction. The right vertebral artery was difficult to visualize, however there is evidence of thumped waveforms, suggestive of possible distal obstruction.  2D Echocardiogram  - Left ventricle: The cavity size was normal. There was mild focalbasal hypertrophy of the septum. Systolic function was normal.The estimated ejection fraction was in the range of 55% to 60%.Wall motion was normal; there were no regional wall motionabnormalities. Doppler parameters are consistent with  abnormalleft ventricular relaxation (grade 1 diastolic dysfunction). - Aortic valve: There was trivial regurgitation. Impressions:  Normal LV systolic function; grade 1 diastolic dysfunction;sclerotic aortic valve; trace AI, MR and TR.   PHYSICAL EXAM  Temp:  [97.6 F (36.4 C)-98.6 F (37 C)] 98.6 F (37 C) (12/29 0555) Pulse Rate:  [81-116] 116 (12/29 0555) Resp:  [20-31] 20 (12/29 0555) BP: (189-204)/(74-96) 204/86 mmHg (12/29 0555) SpO2:  [95 %-98 %] 95 % (12/29 0555)  Limited exam due to comfort care.  General - Well nourished, well developed, in no apparent distress but unresponsive lying in bed.  Ophthalmologic - Fundi not visualized due to comfort care.  Cardiovascular - Regular rate and rhythm.  Neuro - unresponsive to voice, not open eyes on voice stimulation, eyes deviated to the right, left facial droop, no spontaneous movement in all extremities.   ASSESSMENT/PLAN Ms. LILIANI WOJTAS is a 81 y.o. female with history of hypertension, hyperlipidemia, arthritis, glaucoma and osteoporosis presenting with left facial droop garbled speech and left hemiplegia. She received IV t-PA 01/04/2015 at 1025.   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 Right V4 occlusion.  Carotid Doppler  R distal ICA occlusion, R VA distal occlusion  2D Echo EF 55-60%, No source of embolus   LDL 79  HgbA1c 6.3 Diet NPO time specified  aspirin 325 mg daily prior to admission, now on No antithrombotic due to comfort care.  Will contact social worker for residential hospice placement. Family would like to be in [place] close to home.  Cerebral edema  Large right MCA and  ACA and partial left ACA stroke  Early cerebral edema on MRI and CT  Expected severe cerebral edema will develope with potential brain herniation  Poor prognosis given age and images  Discussed with family extensively and they all agree with comfort care and DNR  Currently at [facility]pt hosp[facility]  Hypertension  Elevated due to high ICP  Comfort care measures  Hyperlipidemia  Home meds:  zocor 40  LDL 79, goal < 70  Other Stroke Risk Factors  Advanced age  Smokeless tobacco (snuff)  ETOH use  Other Active Problems  Hypokalemia, K 3.3. Replaced  Renal insufficiency, Cr 1.1  Hospital day # 2  Rosalin Hawking, MD PhD Stroke Neurology 01/02/2015 12:58 PM      To contact Stroke Continuity provider, please refer to [email or web address] After hours, contact General Neurology

## 2015-01-03 DIAGNOSIS — R402433 Glasgow coma scale score 3-8, at hospital admission: Secondary | ICD-10-CM

## 2015-01-03 MED ORDER — SCOPOLAMINE 1 MG/3DAYS TD PT72
1.0000 | MEDICATED_PATCH | TRANSDERMAL | Status: DC
  Administered 2015-01-03: 1.5 mg via TRANSDERMAL
  Filled 2015-01-03: qty 1

## 2015-01-03 MED ORDER — SODIUM CHLORIDE 0.9 % IV SOLN
1.0000 mg/h | INTRAVENOUS | Status: DC
  Filled 2015-01-03: qty 10

## 2015-01-05 NOTE — Care Management Important Message (Signed)
Important Message  Patient Details  Name: Jenna Arnold MRN: [id number] Date of Birth: [birth year]   Medicare Important Message Given:  Yes    Kami Kube P [place] 01/12/2015, 3:01 PM

## 2015-01-05 NOTE — Clinical Social Work Note (Addendum)
CSW received consult for hospice placement, patient's family would like [facility], [place] contacted facility they are going to review patient's clinicals which were faxed to facility and call CSW back if they can take patient.  CSW to continue to follow patient's progress.  1:00pm  CSW received phone call from [facility], who said they can take patient, however they do not have a bed available currently.  CSW to update patient's family and see if they want to look at a different facility.  1:30pm  CSW spoke with patient's family they would like to wait for a bed at [facility] due to all of patient's family and friends in the [place] area.  [facility] was [facility] that patient may have a bed available tomorrow and currently patient is 3rd on the wait list.  [facility] will update CSW on status of bed availaibilty, patient's family does not want her to go to a different facility.  DNR on patient's chart awaiting signature by physician.  Jones Broom. [place], MSW, [place] January 28, 2015 11:46 AM

## 2015-01-05 NOTE — Progress Notes (Signed)
STROKE TEAM PROGRESS NOTE   SUBJECTIVE (INTERVAL HISTORY) Her families are at the bedside. She is unresponsive but appears not to be in distress. As per nurse, she has increased secretions and on robinul PRN. Will put on scopolamine patch also. Social worker worked on the placement and will likely to have residential hospice bed tomorrow.   OBJECTIVE Temp:  [98.6 F (37 C)] 98.6 F (37 C) (12/30 0504) Pulse Rate:  [89] 89 (12/30 0504) Cardiac Rhythm:  [-]  Resp:  [20] 20 (12/30 0504) BP: (151)/(71) 151/71 mmHg (12/30 0504) SpO2:  [94 %] 94 % (12/30 0504)  CBC:   Recent Labs Lab 12/23/2014 1002 12/16/2014 1011 01/01/15 0830  WBC 5.7  --  8.3  NEUTROABS 1.9  --   --   HGB 12.5 15.6* 12.7  HCT 38.9 46.0 39.0  MCV 88.8  --  86.7  PLT 211  --  [id number]    Basic Metabolic Panel:   Recent Labs Lab 12/27/2014 1002 12/11/2014 1011 01/01/15 0830  NA 138 140 140  K 3.5 3.3* 3.7  CL 105 105 109  CO2 20*  --  22  GLUCOSE 154* 148* 153*  BUN 15 16 12   CREATININE 1.15* 1.10* 0.98  CALCIUM 9.5  --  9.4    Lipid Panel:     Component Value Date/Time   CHOL 170 01/01/2015 0212   TRIG 30 01/01/2015 0212   HDL 85 01/01/2015 0212   CHOLHDL 2.0 01/01/2015 0212   VLDL 6 01/01/2015 0212   LDLCALC 79 01/01/2015 0212   HgbA1c:  Lab Results  Component Value Date   HGBA1C 6.3* 01/01/2015   Urine Drug Screen: No results found for: LABOPIA, COCAINSCRNUR, LABBENZ, AMPHETMU, THCU, LABBARB    IMAGING I have personally reviewed the radiological images below and agree with the radiology interpretations.  Ct Head Wo Contrast 12/17/2014   No post tPA hemorrhage is observed. Evolving cytotoxic edema throughout the RIGHT MCA territory, notably in the RIGHT frontal and temporal cortex, insula, as well as the RIGHT lentiform nucleus and adjacent white matter. Cannot exclude large vessel occlusion in the distal M1 proximal M2 regions, RIGHT MCA.   Ct Head Wo Contrast 01/04/2015  1. Motion degraded  examination without acute intracranial abnormality identified. 2. Extensive chronic small vessel ischemic disease.  Dg Chest Port 1 View 01/01/2015  Small area of infiltrate lateral left base, suspicious for pneumonia. Lungs elsewhere clear. Heart borderline enlarged. Calcification of uncertain location and etiology in the left upper quadrant region measuring 4.2 x 3.7 cm.   Ct Angio Neck and head W/cm &/or Wo/cm 01/01/2015   IMPRESSION: 1. Emergent large vessel occlusions: Occluded right ICA siphon, right M1 segment, and right A1 segment. 2. Poor right MCA reconstitution/collaterals. The left ACA likely was anatomically supplied from the right, with moderate to poor bilateral ACA flow. 3. Occluded distal right vertebral artery V4 segment, appears to be chronic. 4. Multifocal high-grade stenoses in the dominant left vertebral artery V4 segment and basilar artery. 5. Negative extracranial carotid arteries aside from tortuosity. 6. Extensive right hemisphere cytotoxic edema but no midline shift or hemorrhagic transformation at this time.    Mr Brain Wo Contrast  01/01/2015  IMPRESSION: Acute infarction affecting the right anterior and middle cerebral artery territories and portions of the left anterior cerebral artery territory. Swelling with mild mass effect, 3 mm right to left shift. No hemorrhage at this time.   Carotid Doppler   1-39% left internal carotid artery stenosis. The left  vertebral artery is patent with antegrade flow. The right internal carotid artery exhibits atypical spiked/thumped waveforms suggestive of a distal obstruction. The right vertebral artery was difficult to visualize, however there is evidence of thumped waveforms, suggestive of possible distal obstruction.  2D Echocardiogram  - Left ventricle: The cavity size was normal. There was mild focalbasal hypertrophy of the septum. Systolic function was normal.The estimated ejection fraction was in the range of 55% to 60%.Wall  motion was normal; there were no regional wall motionabnormalities. Doppler parameters are consistent with abnormalleft ventricular relaxation (grade 1 diastolic dysfunction). - Aortic valve: There was trivial regurgitation. Impressions:  Normal LV systolic function; grade 1 diastolic dysfunction;sclerotic aortic valve; trace AI, MR and TR.   PHYSICAL EXAM  Temp:  [98.6 F (37 C)] 98.6 F (37 C) (12/30 0504) Pulse Rate:  [89] 89 (12/30 0504) Resp:  [20] 20 (12/30 0504) BP: (151)/(71) 151/71 mmHg (12/30 0504) SpO2:  [94 %] 94 % (12/30 0504)  Limited exam due to comfort care.  General - Well nourished, well developed, in no apparent distress but unresponsive lying in bed.  Ophthalmologic - Fundi not visualized due to comfort care.  Cardiovascular - Regular rate and rhythm.  Neuro - unresponsive to voice, not open eyes on voice stimulation, eyes deviated to the right, left facial droop, no spontaneous movement in all extremities.   ASSESSMENT/PLAN Jenna Arnold is a 82 y.o. female with history of hypertension, hyperlipidemia, arthritis, glaucoma and osteoporosis presenting with left facial droop garbled speech and left hemiplegia. She received IV t-PA 12/29/2014 at 1025.   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 Right V4 occlusion.  Carotid Doppler  R distal ICA occlusion, R VA distal occlusion  2D Echo EF 55-60%, No source of embolus   LDL 79  HgbA1c 6.3 Diet NPO time specified  aspirin 325 mg daily prior to admission, now on No antithrombotic due to comfort care.  Will contact social worker for residential hospice placement. Family would like to be in  [place] close to home.  Cerebral edema  Large right MCA and ACA and partial left ACA stroke  Early cerebral edema on MRI and CT  Expected severe cerebral edema will develope with potential brain herniation  Poor prognosis given age and images  Discussed with family extensively and they all agree with comfort care and DNR  Currently at inpt hospice  Hypertension  Elevated due to high ICP  Comfort care measures  Hyperlipidemia  Home meds:  zocor 40  LDL 79, goal < 70  Other Stroke Risk Factors  Advanced age  Smokeless tobacco (snuff)  ETOH use  Other Active Problems  Hypokalemia, K 3.3. Replaced  Renal insufficiency, Cr 1.1  Increased secretion - on robinul PRN IV and will put on scopolamine patch also.  Hospital day # 3  Rosalin Hawking, MD PhD Stroke Neurology 01-11-2015 4:11 PM      To contact Stroke Continuity provider, please refer to [email or web address] After hours, contact General Neurology

## 2015-01-05 NOTE — Progress Notes (Signed)
While making Spiritual Care rounds, Chaplain entered the room of a patient who had a large supportive gathering who were there to visit the patient. The patient was not awake at this time.  It was reported that they were all children of the patient,  which  was very evident by there great love and affection shown towards her.  They reported prior to her short admission to the hospital the patient was very active and well able to care for herself.  They also reported that there are five living generations of the patient's family who all are a active part of her daily life. Chaplain was welcomed to visit at any time, and they were appreciative of all the support the patient has received.  Chaplain Yaakov Guthrie [phone]

## 2015-01-05 NOTE — Progress Notes (Signed)
Pt without pulse or respirations. TOD 1730. Second verification by Kathrine Cords, RN.

## 2015-01-05 DEATH — deceased

## 2015-01-08 ENCOUNTER — Telehealth: Payer: Self-pay | Admitting: Neurology

## 2015-01-08 NOTE — Telephone Encounter (Signed)
[employer] called and needs to know if Dr. Erlinda Hong will sign death cert. Please call Jenna Arnold [phone]

## 2015-01-08 NOTE — Telephone Encounter (Signed)
Rn talk to [place] at [employer]. Rn explain that Dr. Erlinda Hong is in the hospital seeing stroke patients this week. Rn explain he will return to the office on Monday. Jenna Arnold stated she will try the hospital first.

## 2015-01-14 NOTE — Telephone Encounter (Signed)
Truman Hayward, with [facility] called to see if Death Cert. Has been signed by Dr. Erlinda Hong. Phone # : [phone]

## 2015-01-14 NOTE — Telephone Encounter (Signed)
Rn talk to Jenna Arnold at [facility]. Rn explain the office was close on Monday because of snow. Rn explain the death certificate was sign and fill out by Dr. Erlinda Hong. Rn explain the death certificate will be at the front desk, in a envelope. Jenna Arnold a staff person will be there to pick it up.

## 2015-02-05 NOTE — Discharge Summary (Signed)
Stroke Discharge Summary  Patient ID: Jenna Arnold   MRN: [id number]      DOB: 1920-10-25  Date of Admission: 12/28/2014 Date of Discharge: 01/14/2015  Attending Physician:  No att. providers found, Stroke MD  Consulting Physician(s):     None  Patient's PCP:  Jenna Maltese, MD  DISCHARGE DIAGNOSIS:  Active Problems:   CVA (cerebral infarction)   Large right MCA ischemic infarct   Right ICA occlusion   Cerebral edema   HTN   HLD   Tobacco abuse  BMI: Body mass index is 23.22 kg/(m^2).  Past Medical History  Diagnosis Date  . Hypertension   . Hyperlipidemia   . Arthritis   . Cataract   . Glaucoma   . Osteoporosis    Past Surgical History  Procedure Laterality Date  . Eye surgery Bilateral 2012  . Lipoma excision Left 07/11/13    left flank      Medication List    ASK your doctor about these medications        acetaminophen 325 MG tablet  Commonly known as:  TYLENOL  Take 325 mg by mouth every 6 (six) hours as needed for mild pain.     amLODipine 10 MG tablet  Commonly known as:  NORVASC  Take 10 mg by mouth daily.     aspirin 325 MG tablet  Take 325 mg by mouth daily.     aspirin EC 81 MG tablet  Take 81 mg by mouth daily.     atorvastatin 40 MG tablet  Commonly known as:  LIPITOR  Take 40 mg by mouth daily.     bimatoprost 0.01 % Soln  Commonly known as:  LUMIGAN  Place 1 drop into both eyes at bedtime.     CALCIUM PO  Take 1 tablet by mouth daily.     metoprolol succinate 50 MG 24 hr tablet  Commonly known as:  TOPROL-XL  Take 50 mg by mouth daily. Take with or immediately following a meal.     traMADol-acetaminophen 37.5-325 MG tablet  Commonly known as:  ULTRACET  Take 1 tablet by mouth daily.        LABORATORY STUDIES CBC    Component Value Date/Time   WBC 8.3 01/01/2015 0830   WBC 5.6 11/07/2013 1008   RBC 4.50 01/01/2015 0830   RBC 4.50 11/07/2013 1008   HGB 12.7 01/01/2015 0830   HGB 12.8 11/07/2013 1008   HCT 39.0  01/01/2015 0830   HCT 40.1 11/07/2013 1008   PLT 232 01/01/2015 0830   PLT 236 11/07/2013 1008   MCV 86.7 01/01/2015 0830   MCV 89 11/07/2013 1008   MCH 28.2 01/01/2015 0830   MCH 28.5 11/07/2013 1008   MCHC 32.6 01/01/2015 0830   MCHC 32.0 11/07/2013 1008   RDW 14.8 01/01/2015 0830   RDW 14.9* 11/07/2013 1008   LYMPHSABS 3.0 12/29/2014 1002   LYMPHSABS 1.5 07/09/2013 1058   MONOABS 0.6 12/30/2014 1002   MONOABS 0.6 07/09/2013 1058   EOSABS 0.2 12/05/2014 1002   EOSABS 0.4 07/09/2013 1058   BASOSABS 0.0 12/05/2014 1002   BASOSABS 0.1 07/09/2013 1058   CMP    Component Value Date/Time   NA 140 01/01/2015 0830   NA 140 11/07/2013 1008   K 3.7 01/01/2015 0830   K 4.6 11/07/2013 1008   CL 109 01/01/2015 0830   CL 106 11/07/2013 1008   CO2 22 01/01/2015 0830   CO2 25 11/07/2013 1008  GLUCOSE 153* 01/01/2015 0830   GLUCOSE 121* 11/07/2013 1008   BUN 12 01/01/2015 0830   BUN 13 11/07/2013 1008   CREATININE 0.98 01/01/2015 0830   CREATININE 1.40* 11/07/2013 1008   CALCIUM 9.4 01/01/2015 0830   CALCIUM 9.0 11/07/2013 1008   PROT 7.6 12/26/2014 1002   PROT 7.6 11/07/2013 1008   ALBUMIN 4.1 12/16/2014 1002   ALBUMIN 3.5 11/07/2013 1008   AST 18 12/18/2014 1002   AST 29 11/07/2013 1008   ALT 12* 12/07/2014 1002   ALT 15 11/07/2013 1008   ALKPHOS 79 12/28/2014 1002   ALKPHOS 84 11/07/2013 1008   BILITOT 0.8 12/22/2014 1002   BILITOT 0.8 11/07/2013 1008   GFRNONAA 49* 01/01/2015 0830   GFRNONAA 37* 11/07/2013 1008   GFRNONAA 42* 07/09/2013 1058   GFRAA 56* 01/01/2015 0830   GFRAA 45* 11/07/2013 1008   GFRAA 49* 07/09/2013 1058   COAGS Lab Results  Component Value Date   INR 1.04 12/24/2014   Lipid Panel    Component Value Date/Time   CHOL 170 01/01/2015 0212   TRIG 30 01/01/2015 0212   HDL 85 01/01/2015 0212   CHOLHDL 2.0 01/01/2015 0212   VLDL 6 01/01/2015 0212   LDLCALC 79 01/01/2015 0212   HgbA1C  Lab Results  Component Value Date   HGBA1C 6.3*  01/01/2015   Cardiac Panel (last 3 results) No results for input(s): CKTOTAL, CKMB, TROPONINI, RELINDX in the last 72 hours. Urinalysis    Component Value Date/Time   COLORURINE Yellow 11/07/2013 1048   APPEARANCEUR Hazy 11/07/2013 1048   LABSPEC 1.008 11/07/2013 1048   PHURINE 6.0 11/07/2013 1048   GLUCOSEU Negative 11/07/2013 1048   HGBUR Negative 11/07/2013 1048   BILIRUBINUR Negative 11/07/2013 1048   KETONESUR Negative 11/07/2013 1048   PROTEINUR 30 mg/dL 11/07/2013 1048   NITRITE Negative 11/07/2013 1048   LEUKOCYTESUR Trace 11/07/2013 1048   Urine Drug Screen No results found for: LABOPIA, COCAINSCRNUR, LABBENZ, AMPHETMU, THCU, LABBARB  Alcohol Level No results found for: [place] I have personally reviewed the radiological images below and agree with the radiology interpretations.  Ct Head Wo Contrast 12/09/2014 No post tPA hemorrhage is observed. Evolving cytotoxic edema throughout the RIGHT MCA territory, notably in the RIGHT frontal and temporal cortex, insula, as well as the RIGHT lentiform nucleus and adjacent white matter. Cannot exclude large vessel occlusion in the distal M1 proximal M2 regions, RIGHT MCA.   Ct Head Wo Contrast 12/26/2014 1. Motion degraded examination without acute intracranial abnormality identified. 2. Extensive chronic small vessel ischemic disease.  Dg Chest Port 1 View 01/01/2015 Small area of infiltrate lateral left base, suspicious for pneumonia. Lungs elsewhere clear. Heart borderline enlarged. Calcification of uncertain location and etiology in the left upper quadrant region measuring 4.2 x 3.7 cm.   Ct Angio Neck and head W/cm &/or Wo/cm 01/01/2015 IMPRESSION: 1. Emergent large vessel occlusions: Occluded right ICA siphon, right M1 segment, and right A1 segment. 2. Poor right MCA reconstitution/collaterals. The left ACA likely was anatomically supplied from the right, with moderate to poor bilateral ACA  flow. 3. Occluded distal right vertebral artery V4 segment, appears to be chronic. 4. Multifocal high-grade stenoses in the dominant left vertebral artery V4 segment and basilar artery. 5. Negative extracranial carotid arteries aside from tortuosity. 6. Extensive right hemisphere cytotoxic edema but no midline shift or hemorrhagic transformation at this time.   Mr Brain Wo Contrast  01/01/2015 IMPRESSION: Acute infarction affecting the right anterior  and middle cerebral artery territories and portions of the left anterior cerebral artery territory. Swelling with mild mass effect, 3 mm right to left shift. No hemorrhage at this time.   Carotid Doppler  1-39% left internal carotid artery stenosis. The left vertebral artery is patent with antegrade flow. The right internal carotid artery exhibits atypical spiked/thumped waveforms suggestive of a distal obstruction. The right vertebral artery was difficult to visualize, however there is evidence of thumped waveforms, suggestive of possible distal obstruction.  2D Echocardiogram  - Left ventricle: The cavity size was normal. There was mild focalbasal hypertrophy of the septum. Systolic function was normal.The estimated ejection fraction was in the range of 55% to 60%.Wall motion was normal; there were no regional wall motionabnormalities. Doppler parameters are consistent with abnormalleft ventricular relaxation (grade 1 diastolic dysfunction). - Aortic valve: There was trivial regurgitation. Impressions: Normal LV systolic function; grade 1 diastolic dysfunction;sclerotic aortic valve; trace AI, MR and TR.    HISTORY OF PRESENT ILLNESS Jenna Arnold is an 82 y.o. female with a history of hypertension, hyperlipidemia, arthritis, glaucoma and osteoporosis, brought to the emergency room and code stroke status following acute onset of left side weakness with garbled speech and facial droop. He was last seen well at 9:15 AM today. She has no  previous history of stroke nor TIA. She's been taking aspirin daily. CT scan of her head showed no acute intracranial abnormality. NIH stroke score was 19. Patient was deemed a candidate for intravenous thrombolytic therapy with TPA, which was administered. Patient was subsequently admitted to neuro ICU for further management. LSN: 9:15 AM on 12/26/2014 tPA Given: Yes   HOSPITAL COURSE Ms. Jenna Arnold is a 82 y.o. female with history of hypertension, hyperlipidemia, arthritis, glaucoma and osteoporosis presenting with left facial droop garbled speech and left hemiplegia. She received IV t-PA 12/23/2014 at 1025. She was then admitted to ICU for post tPA care. MRI showed larger right MCA infarct and cerebral edema. CTA showed right ICA occlusion. Pt remained in coma during admission. With extremely poor prognosis, I had long discussion with family and they requested comfort care measures. Pt was expired on 01-29-15 17:30.   Stroke: Non-dominant right MCA territory infarct secondary to occluded R ICA, embolic due to unknown source  Resultant Left hemiparesis, unresponsive, dyaphagia  MRI Complete right MCA and ACA infarcts, partial left ACA infarct   CTA head and neck right supraclinoid ICA occlusion, no visualization of right ACA, MCA and left ACA with poor collaterals. Right V4 occlusion.  Carotid Doppler R distal ICA occlusion, R VA distal occlusion  2D Echo EF 55-60%, No source of embolus  LDL 79  HgbA1c 6.3  Cerebral edema  Large right MCA and ACA and partial left ACA stroke  Early cerebral edema on MRI and CT  Expected severe cerebral edema will develope with potential brain herniation  Poor prognosis given age and images  Discussed with family extensively and they all agree with comfort care and DNR  Hypertension  Elevated due to high ICP  Comfort care measures  Hyperlipidemia  Home meds: zocor 40  LDL 79, goal < 70  Other Stroke Risk Factors  Advanced  age  Smokeless tobacco (snuff)  ETOH use  Other Active Problems  Hypokalemia, K 3.3. Replaced  Renal insufficiency, Cr 1.1  DISCHARGE EXAM Pt deceased.  20 minutes were spent preparing discharge.  Rosalin Hawking, MD PhD Stroke Neurology 01/14/2015 10:44 AM

## 2016-05-19 IMAGING — CR DG CHEST 1V PORT
1 series · 1 of 1 positions shown · non-contrast
Comparison: None.

CLINICAL DATA: Congestive heart failure

EXAM:
PORTABLE CHEST 1 VIEW

[AP]
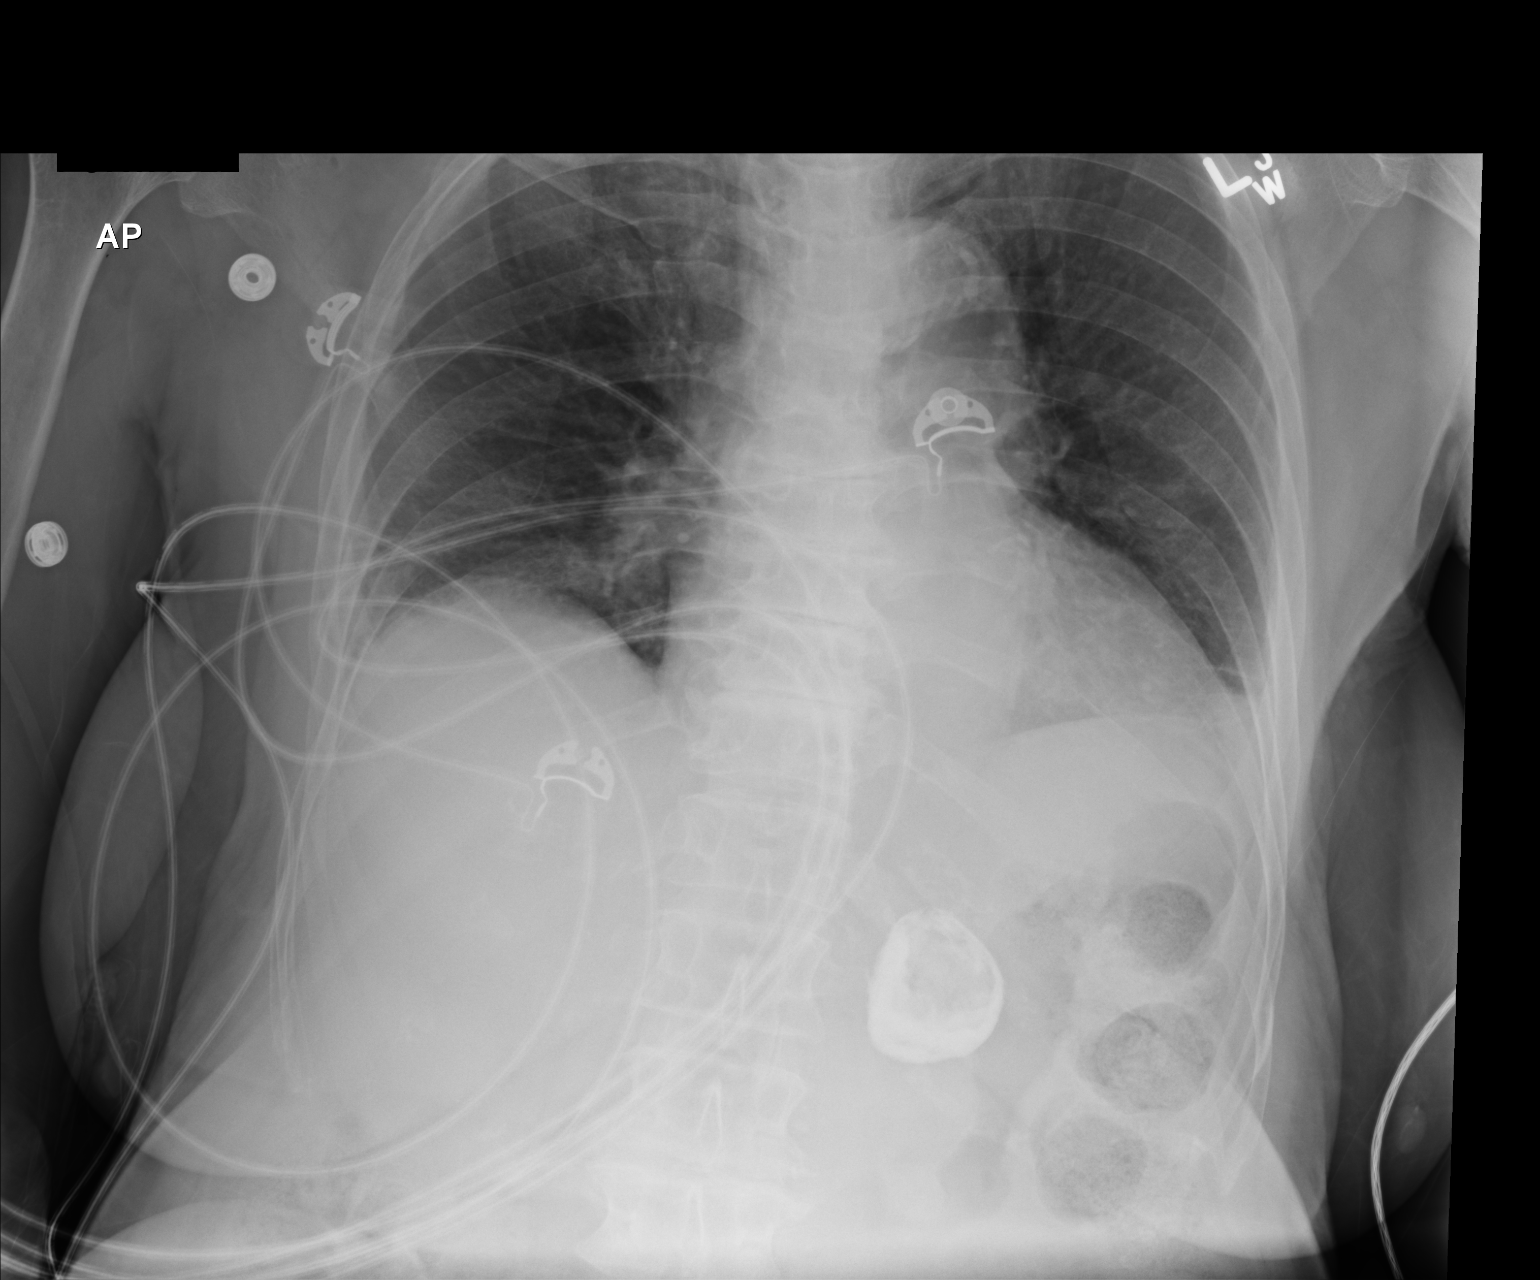

[1 of 1 positions shown; findings below may reference images not displayed]

FINDINGS: There is a small area of infiltrate in the lateral left base. The
lungs elsewhere clear. Heart is borderline enlarged with pulmonary
vascular within normal limits. There is atherosclerotic change in
aorta. No pneumothorax.

There is an apparent calcification in the left upper quadrant
measuring 4.2 x 3.7 cm.
IMPRESSION: Small area of infiltrate lateral left base, suspicious for
pneumonia. Lungs elsewhere clear. Heart borderline enlarged.

Calcification of uncertain location and etiology in the left upper
quadrant region measuring 4.2 x 3.7 cm.

## 2016-05-19 IMAGING — MR MR HEAD W/O CM
8 of 10 series · 39 of 48 positions shown · non-contrast
Comparison: Head CT 12/31/2014

CLINICAL DATA: Code stroke patient treated with tPA. Left-sided
weakness and speech disturbance.

EXAM:
MRI HEAD WITHOUT CONTRAST
TECHNIQUE: Multiplanar, multiecho pulse sequences of the brain and surrounding
structures were obtained without intravenous contrast.

[Series 4: DWI · axial · 3.6mm · 0.94mm/px · z∈[-88,+42]mm · 11 of 78 slices shown (1 of 4)]
[im 1/78]
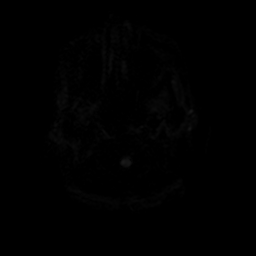
[im 8/78]
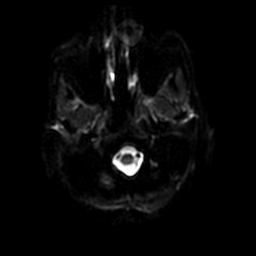
[im 16/78]
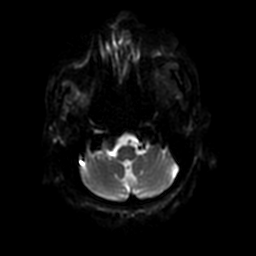
[im 24/78]
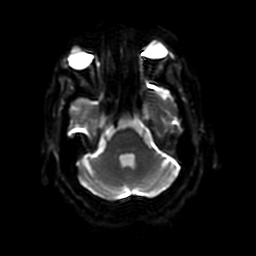
[im 31/78]
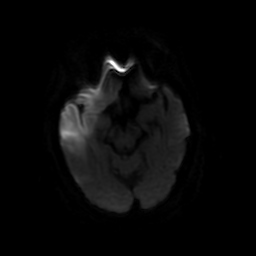
[im 39/78]
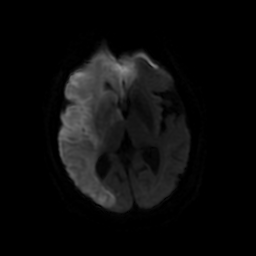
[im 47/78]
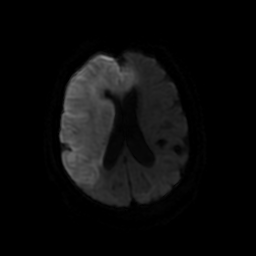
[im 54/78]
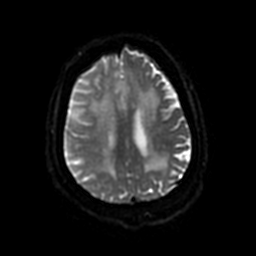
[im 62/78]
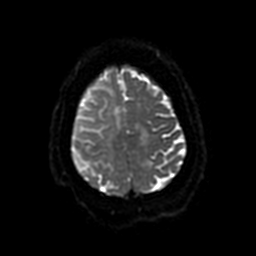
[im 70/78]
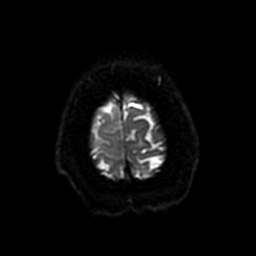
[im 78/78]
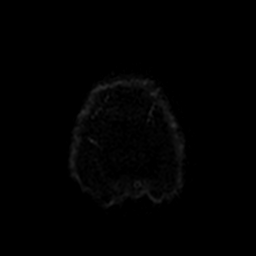

[Series 6: T2 · axial · 5.0mm · 0.43mm/px · z∈[-85,-22]mm · 2 of 26 slices shown]
[im 1/26]
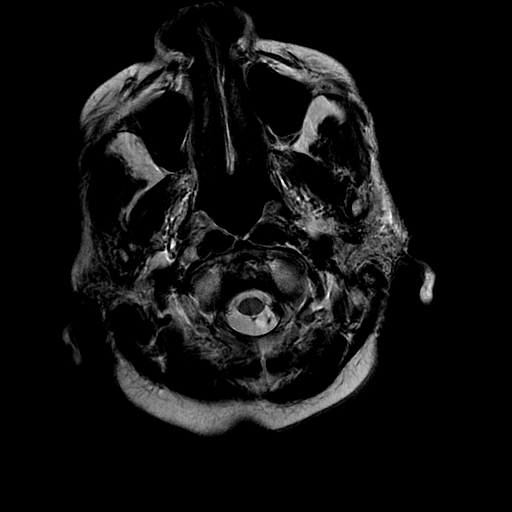
[im 13/26]
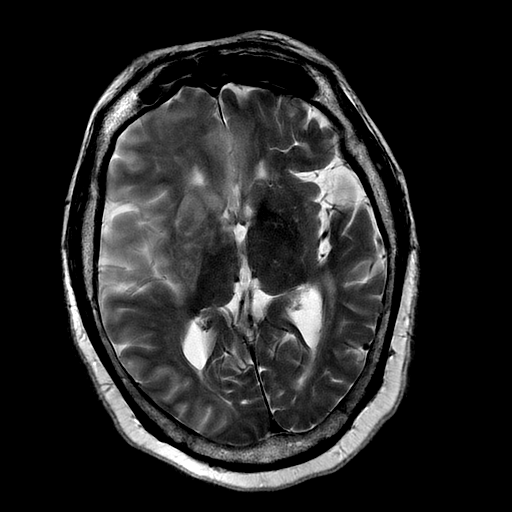

[Series 7: FLAIR · axial · 5.0mm · 0.43mm/px · z∈[-85,+47]mm · 3 of 26 slices shown (1 of 3)]
[im 1/26]
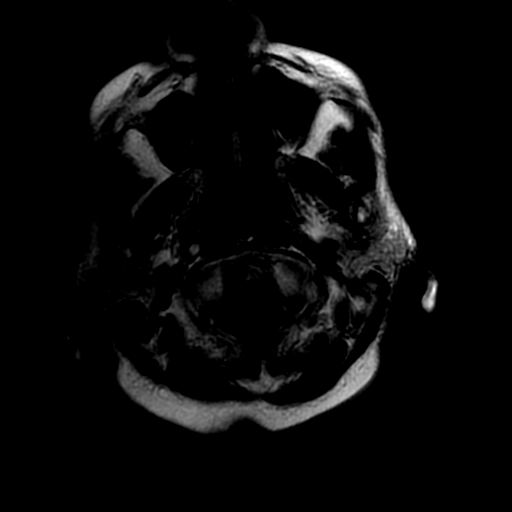
[im 13/26]
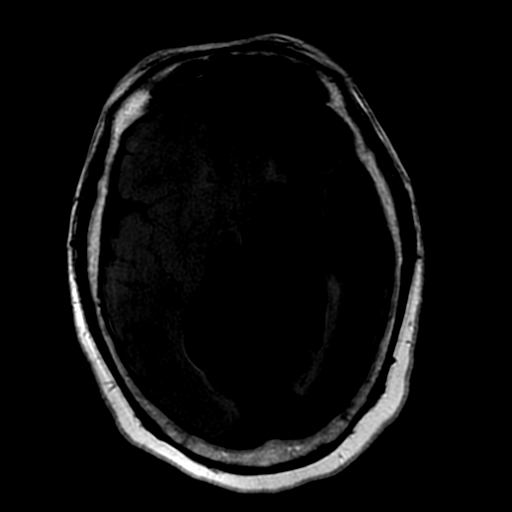
[im 26/26]
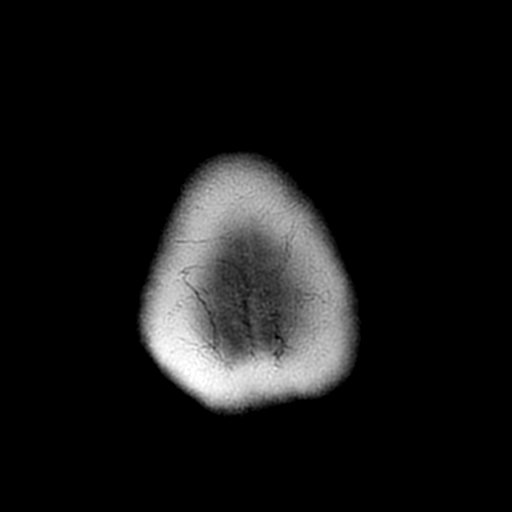

[Series 8: DWI · coronal · 5.0mm · 0.94mm/px · 8 of 68 slices shown (2 of 4)]
[im 1/68]
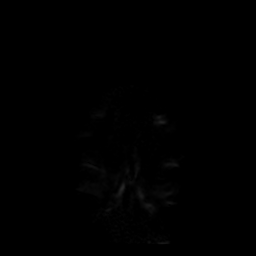
[im 9/68]
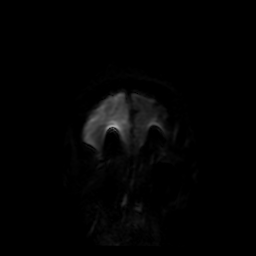
[im 17/68]
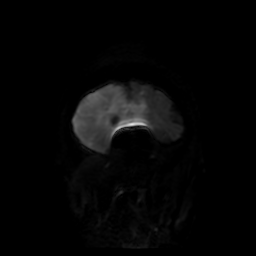
[im 26/68]
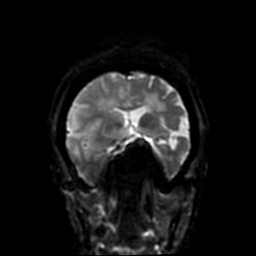
[im 42/68]
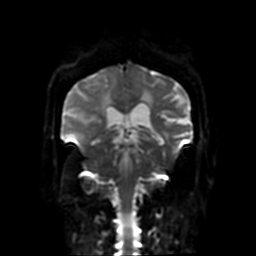
[im 51/68]
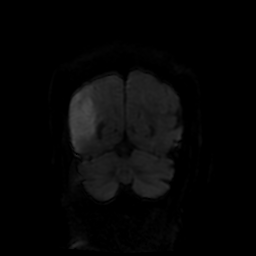
[im 59/68]
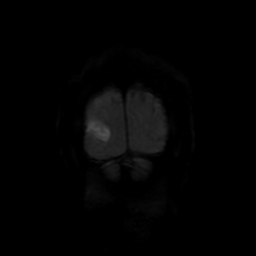
[im 68/68]
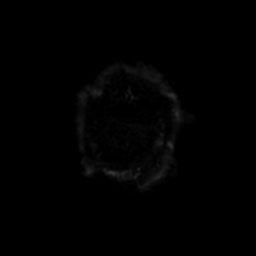

[Series 9: FLAIR · axial · 5.0mm · 0.43mm/px · z∈[-85,+47]mm · 3 of 26 slices shown (2 of 3)]
[im 1/26]
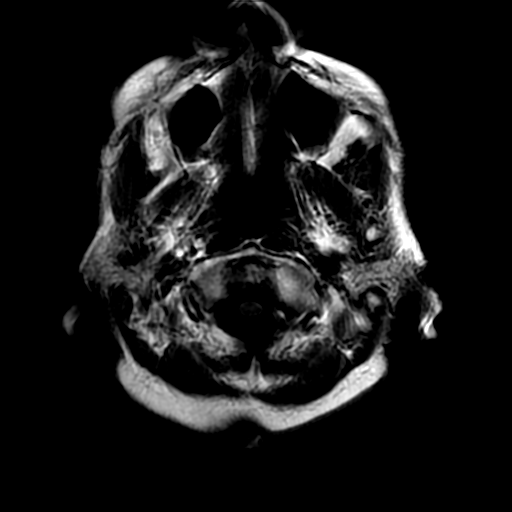
[im 13/26]
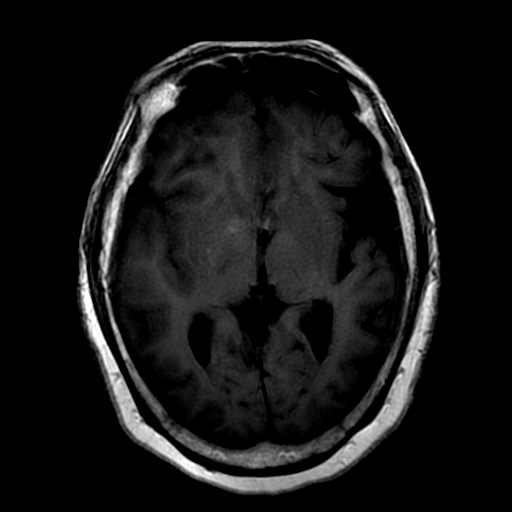
[im 26/26]
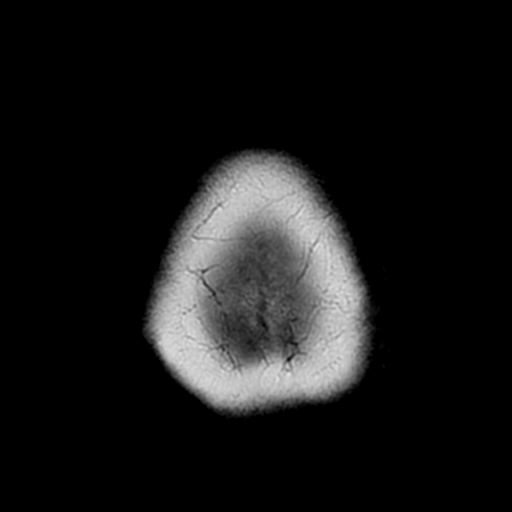

[Series 10: FLAIR · sagittal · 5.0mm · 0.54mm/px · 3 of 23 slices shown (3 of 3)]
[im 1/23]
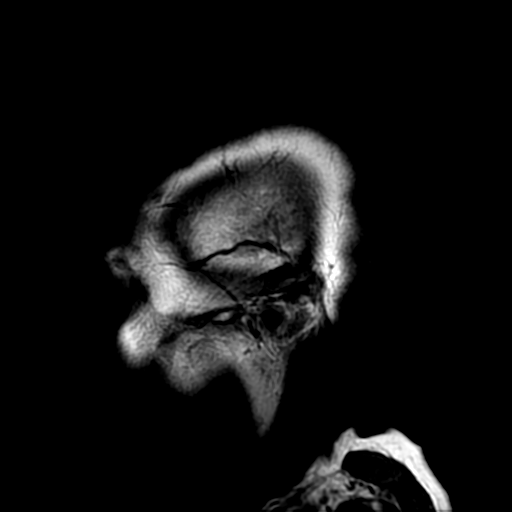
[im 12/23]
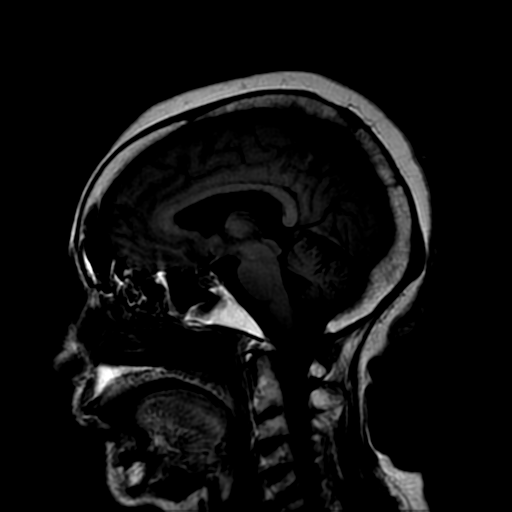
[im 23/23]
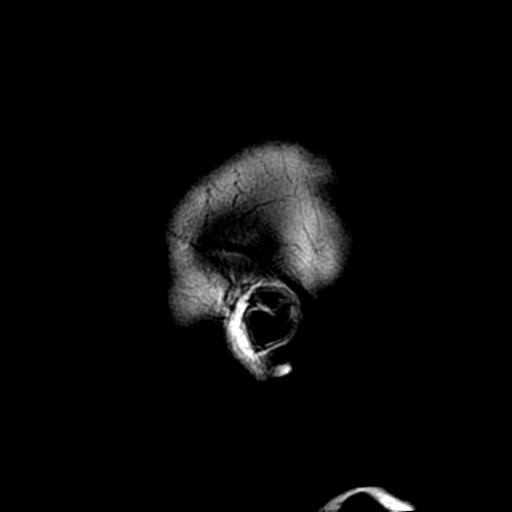

[Series 400: DWI · axial · 3.6mm · 0.94mm/px · z∈[-85,+42]mm · 5 of 38 slices shown (3 of 4)]
[im 1/38]
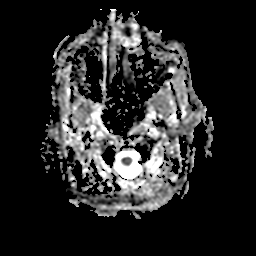
[im 10/38]
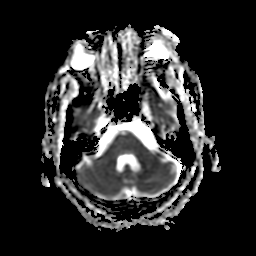
[im 19/38]
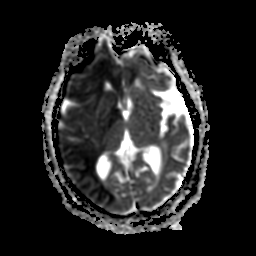
[im 28/38]
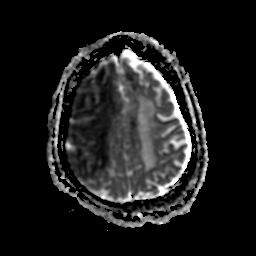
[im 38/38]
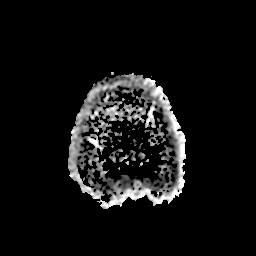

[Series 800: DWI · coronal · 5.0mm · 0.94mm/px · 4 of 34 slices shown (4 of 4)]
[im 1/34]
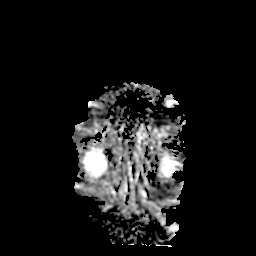
[im 12/34]
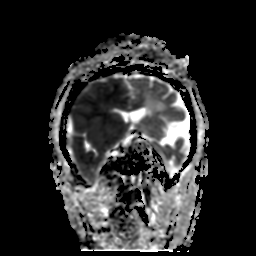
[im 23/34]
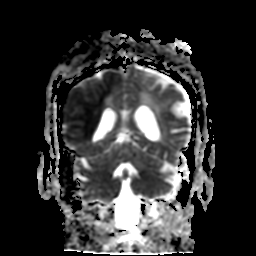
[im 34/34]
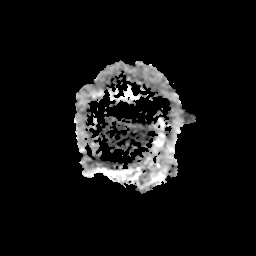

[39 of 48 positions shown; findings below may reference images not displayed]

FINDINGS: Diffusion imaging shows acute infarction throughout the entire right
anterior and middle cerebral artery territories. There is also some
infarction in the left frontal region suggesting at least some
dominance of the right anterior circulation in the ACA region. The
area of infarction shows swelling and there is early mass effect
with right-to-left shift of 3 mm. No sign of hemorrhagic
transformation. Elsewhere, the brain shows moderate chronic
small-vessel ischemic changes, fairly typical for age. No
extra-axial collection. No evidence of mass. Sinuses are clear.
IMPRESSION: Acute infarction affecting the right anterior and middle cerebral
artery territories and portions of the left anterior cerebral artery
territory. Swelling with mild mass effect, 3 mm right to left shift.
No hemorrhage at this time.
